# Patient Record
Sex: Female | Born: 1950 | Race: White | Hispanic: No | Marital: Single | Smoking: Never smoker
Health system: Southern US, Community
[De-identification: ages and names within clinical notes are randomized; demographics above are authoritative.]

## PROBLEM LIST (undated history)

## (undated) DIAGNOSIS — F419 Anxiety disorder, unspecified: Secondary | ICD-10-CM

## (undated) DIAGNOSIS — I10 Essential (primary) hypertension: Secondary | ICD-10-CM

## (undated) DIAGNOSIS — R609 Edema, unspecified: Secondary | ICD-10-CM

## (undated) HISTORY — PX: TUBAL LIGATION: SHX77

---

## 2001-06-19 ENCOUNTER — Emergency Department (HOSPITAL_COMMUNITY): Admission: EM | Admit: 2001-06-19 | Discharge: 2001-06-19 | Payer: Self-pay | Admitting: *Deleted

## 2005-06-25 ENCOUNTER — Ambulatory Visit: Payer: Self-pay | Admitting: *Deleted

## 2005-06-25 ENCOUNTER — Ambulatory Visit: Payer: Self-pay | Admitting: Nurse Practitioner

## 2005-07-01 ENCOUNTER — Emergency Department (HOSPITAL_COMMUNITY): Admission: EM | Admit: 2005-07-01 | Discharge: 2005-07-01 | Payer: Self-pay | Admitting: Emergency Medicine

## 2010-11-16 ENCOUNTER — Encounter: Payer: Self-pay | Admitting: Family Medicine

## 2010-11-16 ENCOUNTER — Emergency Department (HOSPITAL_BASED_OUTPATIENT_CLINIC_OR_DEPARTMENT_OTHER)
Admission: EM | Admit: 2010-11-16 | Discharge: 2010-11-16 | Disposition: A | Discharge status: Home / Self Care | Payer: Medicare Other | Attending: Emergency Medicine | Admitting: Emergency Medicine

## 2010-11-16 DIAGNOSIS — M5430 Sciatica, unspecified side: Secondary | ICD-10-CM

## 2010-11-16 DIAGNOSIS — M810 Age-related osteoporosis without current pathological fracture: Secondary | ICD-10-CM | POA: Insufficient documentation

## 2010-11-16 DIAGNOSIS — I10 Essential (primary) hypertension: Secondary | ICD-10-CM | POA: Insufficient documentation

## 2010-11-16 DIAGNOSIS — J45909 Unspecified asthma, uncomplicated: Secondary | ICD-10-CM | POA: Insufficient documentation

## 2010-11-16 DIAGNOSIS — E669 Obesity, unspecified: Secondary | ICD-10-CM | POA: Insufficient documentation

## 2010-11-16 DIAGNOSIS — M7989 Other specified soft tissue disorders: Secondary | ICD-10-CM | POA: Insufficient documentation

## 2010-11-16 HISTORY — DX: Edema, unspecified: R60.9

## 2010-11-16 HISTORY — DX: Anxiety disorder, unspecified: F41.9

## 2010-11-16 HISTORY — DX: Essential (primary) hypertension: I10

## 2010-11-16 MED ORDER — HYDROCODONE-ACETAMINOPHEN 5-500 MG PO TABS: 1.0000 | ORAL_TABLET | Freq: Four times a day (QID) | ORAL | Status: AC | PRN

## 2010-11-16 MED ORDER — KETOROLAC TROMETHAMINE 30 MG/ML IJ SOLN
30.0000 mg | Freq: Once | INTRAMUSCULAR | Status: AC
  Administered 2010-11-16: 30 mg via INTRAMUSCULAR
  Filled 2010-11-16: qty 1

## 2010-11-16 MED ORDER — HYDROCODONE-ACETAMINOPHEN 5-325 MG PO TABS
1.0000 | ORAL_TABLET | Freq: Once | ORAL | Status: AC
  Administered 2010-11-16: 1 via ORAL
  Filled 2010-11-16: qty 1

## 2010-11-16 MED ORDER — PENICILLIN V POTASSIUM 500 MG PO TABS: 500.0000 mg | ORAL_TABLET | Freq: Three times a day (TID) | ORAL | Status: AC

## 2010-11-16 NOTE — ED Provider Notes (Signed)
History     CSN: [id number] Arrival date & time: 11/16/2010 12:33 PM   Chief Complaint  Patient presents with  . Leg Swelling     (Include location/radiation/quality/duration/timing/severity/associated sxs/prior treatment) Patient is a 60 y.o. female presenting with lower extremity pain. The history is provided by the patient and a relative.  Foot Pain This is a recurrent problem. The current episode started more than 1 week ago. The problem occurs constantly. The problem has not changed since onset.  the patient does have pain in her left foot but she feels that the pain is radiating from her left lower back and left hip area. She does have a history of degenerative degenerative disc disease. Patient denies any fevers abdominal pain nausea or vomiting. She denies any difficulty with bowels or bladder. She does have a doctor's appointment at the end of this month for further evaluation. Patient does have chronic obesity as well as hypertension. The patient also complains of chronic dental caries in the right lower molars, no changes.   Past Medical History  Diagnosis Date  . Hypertension   . Osteoporosis   . Anxiety   . Swelling   . Asthma   . Gout      Past Surgical History  Procedure Date  . Tubal ligation     No family history on file.  History  Substance Use Topics  . Smoking status: Never Smoker   . Smokeless tobacco: Not on file  . Alcohol Use: No    OB History    Grav Para Term Preterm Abortions TAB SAB Ect Mult Living                  Review of Systems  All other systems reviewed and are negative.    Allergies  Sulfa antibiotics  Home Medications   Current Outpatient Rx  Name Route Sig Dispense Refill  . CYMBALTA PO Oral Take by mouth.      Marland Kitchen LASIX PO Oral Take by mouth.      Marland Kitchen HYDROCHLOROTHIAZIDE PO Oral Take by mouth.      Marland Kitchen PERCOCET PO Oral Take by mouth.        Physical Exam    BP 187/100  Pulse 84  Temp(Src) 97.8 F (36.6 C) (Oral)   Resp 20  Ht 5' 3.5" (1.613 m)  Wt 285 lb (129.275 kg)  BMI 49.69 kg/m2  SpO2 98%  Physical Exam  Constitutional: She appears well-developed and well-nourished. No distress.       Morbidly obese, deconditioning, no acute distress  HENT:  Head: Normocephalic.  Eyes: Pupils are equal, round, and reactive to light.  Cardiovascular: Normal heart sounds.   Pulmonary/Chest: Breath sounds normal.  Abdominal: Soft.  Musculoskeletal: Normal range of motion. She exhibits no edema and no tenderness.       Diffuse tenderness to left paralumbar musculature. No clubbing cyanosis or edema to the lower extremities. Skin is warm and dry with no erythema no increased warmth or rash.  Neurological: She is alert. She displays normal reflexes. No cranial nerve deficit. She exhibits normal muscle tone. Coordination normal.  Skin: Skin is warm and dry.    ED Course  Procedures  No results found for this or any previous visit. No results found.   No diagnosis found.   MDM Pt is seen and examined;  Initial history and physical completed.  Will follow.         Satsuki Zillmer A. Patrica Duel, MD 11/16/10 1304

## 2010-11-16 NOTE — ED Notes (Addendum)
Pt c/o bilateral foot pain and swelling x 3 days. Pt also c/o toothache in right lower. Pt sts she is "in between doctors right now and out of pain medications and other meds". Pt sts she did not take her BP med today.

## 2010-11-21 ENCOUNTER — Encounter (HOSPITAL_BASED_OUTPATIENT_CLINIC_OR_DEPARTMENT_OTHER): Payer: Self-pay | Admitting: *Deleted

## 2010-11-21 ENCOUNTER — Emergency Department (HOSPITAL_BASED_OUTPATIENT_CLINIC_OR_DEPARTMENT_OTHER)
Admission: EM | Admit: 2010-11-21 | Discharge: 2010-11-21 | Disposition: A | Discharge status: Home / Self Care | Payer: Medicare Other | Attending: Emergency Medicine | Admitting: Emergency Medicine

## 2010-11-21 DIAGNOSIS — M5430 Sciatica, unspecified side: Secondary | ICD-10-CM | POA: Insufficient documentation

## 2010-11-21 DIAGNOSIS — J45909 Unspecified asthma, uncomplicated: Secondary | ICD-10-CM | POA: Insufficient documentation

## 2010-11-21 DIAGNOSIS — I10 Essential (primary) hypertension: Secondary | ICD-10-CM | POA: Insufficient documentation

## 2010-11-21 DIAGNOSIS — Z79899 Other long term (current) drug therapy: Secondary | ICD-10-CM | POA: Insufficient documentation

## 2010-11-21 DIAGNOSIS — M810 Age-related osteoporosis without current pathological fracture: Secondary | ICD-10-CM | POA: Insufficient documentation

## 2010-11-21 LAB — DIFFERENTIAL
Eosinophils Absolute: 0.2 10*3/uL (ref 0.0–0.7)
Lymphocytes Relative: 49 % — ABNORMAL HIGH (ref 12–46)
Monocytes Absolute: 0.6 10*3/uL (ref 0.1–1.0)
Neutrophils Relative %: 39 % — ABNORMAL LOW (ref 43–77)

## 2010-11-21 LAB — BASIC METABOLIC PANEL
CO2: 30 mEq/L (ref 19–32)
Chloride: 99 mEq/L (ref 96–112)
Creatinine, Ser: 0.9 mg/dL (ref 0.50–1.10)
GFR calc Af Amer: >60 mL/min (ref >60)
Glucose, Bld: 112 mg/dL — ABNORMAL HIGH (ref 70–99)
Potassium: 3.3 mEq/L — ABNORMAL LOW (ref 3.5–5.1)

## 2010-11-21 LAB — CBC
MCV: 85.7 fL (ref 78.0–100.0)
Platelets: 269 10*3/uL (ref 150–400)
RBC: 4.56 MIL/uL (ref 3.87–5.11)

## 2010-11-21 MED ORDER — ENOXAPARIN SODIUM 100 MG/ML ~~LOC~~ SOLN
100.0000 mg | Freq: Once | SUBCUTANEOUS | Status: AC
  Administered 2010-11-21: 100 mg via SUBCUTANEOUS
  Filled 2010-11-21: qty 1

## 2010-11-21 MED ORDER — KETOROLAC TROMETHAMINE 30 MG/ML IJ SOLN
30.0000 mg | Freq: Once | INTRAMUSCULAR | Status: AC
  Administered 2010-11-21: 30 mg via INTRAVENOUS
  Filled 2010-11-21: qty 1

## 2010-11-21 MED ORDER — DIAZEPAM 5 MG PO TABS: 5.0000 mg | ORAL_TABLET | Freq: Three times a day (TID) | ORAL | Status: AC | PRN

## 2010-11-21 MED ORDER — OXYCODONE-ACETAMINOPHEN 5-325 MG PO TABS: 1.0000 | ORAL_TABLET | Freq: Four times a day (QID) | ORAL | Status: AC | PRN

## 2010-11-21 MED ORDER — HYDROMORPHONE HCL 2 MG/ML IJ SOLN
2.0000 mg | Freq: Once | INTRAMUSCULAR | Status: AC
  Administered 2010-11-21: 2 mg via INTRAMUSCULAR
  Filled 2010-11-21: qty 1

## 2010-11-21 NOTE — ED Provider Notes (Signed)
History     CSN: [id number] Arrival date & time: 11/21/2010  7:03 PM  Chief Complaint  Patient presents with  . Leg Pain    HPI  (Consider location/radiation/quality/duration/timing/severity/associated sxs/prior treatment)  HPI patient has been having pain in her leg for the last week or so. The pain seems to originate in her lower back and radiates down the left leg. It is sharp pain that increases with movement. The severity has been severe. Patient's not having urinary incontinence, no numbness or weakness, no fevers, no chest pain or shortness of breath. She has not had any abdominal pain. Family brought her back because she continues to have leg pain -- feels like her leg is a bit swollen. Patient does have a history of peripheral edema but she feels like it worse in the left leg now.  Past Medical History  Diagnosis Date  . Hypertension   . Osteoporosis   . Anxiety   . Swelling   . Asthma   . Gout     Past Surgical History  Procedure Date  . Tubal ligation     History reviewed. No pertinent family history.  History  Substance Use Topics  . Smoking status: Never Smoker   . Smokeless tobacco: Not on file  . Alcohol Use: No    OB History    Grav Para Term Preterm Abortions TAB SAB Ect Mult Living                  Review of Systems  Review of Systems  All other systems reviewed and are negative.    Allergies  Sulfa antibiotics  Home Medications   Current Outpatient Rx  Name Route Sig Dispense Refill  . HYDROCHLOROTHIAZIDE 25 MG PO TABS Oral Take 25 mg by mouth daily.      Marland Kitchen PENICILLIN V POTASSIUM 500 MG PO TABS Oral Take 1 tablet (500 mg total) by mouth 3 (three) times daily. 30 tablet 0  . ALBUTEROL SULFATE HFA 108 (90 BASE) MCG/ACT IN AERS Inhalation Inhale 2 puffs into the lungs every 6 (six) hours as needed. For asthma attack     . CYMBALTA PO Oral Take by mouth.      Marland Kitchen LASIX PO Oral Take by mouth.      Marland Kitchen HYDROCHLOROTHIAZIDE PO Oral Take by mouth.       Marland Kitchen HYDROCODONE-ACETAMINOPHEN 5-500 MG PO TABS Oral Take 1-2 tablets by mouth every 6 (six) hours as needed for pain. 18 tablet 0  . PERCOCET PO Oral Take by mouth.        Physical Exam    BP 160/98  Pulse 90  Temp(Src) 98.1 F (36.7 C) (Oral)  Resp 20  Ht 5' 3.5" (1.613 m)  Wt 300 lb (136.079 kg)  BMI 52.31 kg/m2  SpO2 97%  Physical Exam  Nursing note and vitals reviewed. Constitutional: No distress.  HENT:  Head: Normocephalic and atraumatic.  Right Ear: External ear normal.  Left Ear: External ear normal.  Eyes: Conjunctivae are normal. Right eye exhibits no discharge. Left eye exhibits no discharge. No scleral icterus.  Neck: Neck supple. No tracheal deviation present.  Cardiovascular: Normal rate, regular rhythm and intact distal pulses.   Pulmonary/Chest: Effort normal and breath sounds normal. No stridor. No respiratory distress. She has no wheezes. She has no rales.  Abdominal: Soft. Bowel sounds are normal. She exhibits no distension. There is no tenderness. There is no rebound and no guarding.  Musculoskeletal: She exhibits edema and tenderness.  Edema to bilateral lower extremities, mild tenderness to the left calf and left thigh, no erythema, no double cords, tenderness palpation lumbar spine, pain with range of motion of her back  Neurological: She is alert. She has normal strength. No sensory deficit. Cranial nerve deficit:  no gross defecits noted. She exhibits normal muscle tone. She displays no seizure activity. Coordination normal.  Skin: Skin is warm and dry. No rash noted.  Psychiatric: She has a normal mood and affect.    ED Course  Procedures (including critical care time)  Labs Reviewed  D-DIMER, QUANTITATIVE - Abnormal; Notable for the following:    D-Dimer, Quant 0.71 (*)    All other components within normal limits  DIFFERENTIAL - Abnormal; Notable for the following:    Neutrophils Relative 39 (*)    Lymphocytes Relative 49 (*)    All  other components within normal limits  CBC  BASIC METABOLIC PANEL   No results found.   1. Sciatica      MDM Patient presents with lower extremity pain and back pain. Her symptoms are most suggestive of sciatica however similar symptoms to suggest the possibility of a DVT. Overall have a low suspicion however she does have an abnormally elevated D-dimer test. Vascular ultrasound is not available the emergency department at this time. Patient will be given a shot of Lovenox and will have her followup tomorrow to have a Doppler study. Patient given prescription for pain medications. While emergency department she was given adult injection of Dilaudid IV.  At this time I do not feel there is any N. suggest acute emergency medical condition. She has been stabilized at this time. She'll followup as discussed previously.        Celene Kras, MD 11/21/10 2027

## 2010-11-21 NOTE — ED Notes (Signed)
Pt seen here Monday. Dx'd with sciatica. Told if no better, to return. States legs are swelling.

## 2010-11-22 ENCOUNTER — Ambulatory Visit (HOSPITAL_BASED_OUTPATIENT_CLINIC_OR_DEPARTMENT_OTHER)
Admission: RE | Admit: 2010-11-22 | Discharge: 2010-11-22 | Disposition: A | Discharge status: Home / Self Care | Payer: Medicare Other | Source: Ambulatory Visit | Attending: Emergency Medicine | Admitting: Emergency Medicine

## 2010-11-22 ENCOUNTER — Other Ambulatory Visit (HOSPITAL_BASED_OUTPATIENT_CLINIC_OR_DEPARTMENT_OTHER): Payer: Self-pay | Admitting: Emergency Medicine

## 2010-11-22 DIAGNOSIS — M79609 Pain in unspecified limb: Secondary | ICD-10-CM | POA: Insufficient documentation

## 2010-11-22 DIAGNOSIS — M25569 Pain in unspecified knee: Secondary | ICD-10-CM | POA: Insufficient documentation

## 2010-11-22 DIAGNOSIS — R52 Pain, unspecified: Secondary | ICD-10-CM

## 2010-12-16 ENCOUNTER — Emergency Department (HOSPITAL_BASED_OUTPATIENT_CLINIC_OR_DEPARTMENT_OTHER)
Admission: EM | Admit: 2010-12-16 | Discharge: 2010-12-16 | Disposition: A | Discharge status: Home / Self Care | Payer: Medicare Other | Attending: Emergency Medicine | Admitting: Emergency Medicine

## 2010-12-16 ENCOUNTER — Encounter (HOSPITAL_BASED_OUTPATIENT_CLINIC_OR_DEPARTMENT_OTHER): Payer: Self-pay | Admitting: *Deleted

## 2010-12-16 DIAGNOSIS — T23269A Burn of second degree of back of unspecified hand, initial encounter: Secondary | ICD-10-CM | POA: Insufficient documentation

## 2010-12-16 DIAGNOSIS — X12XXXA Contact with other hot fluids, initial encounter: Secondary | ICD-10-CM | POA: Insufficient documentation

## 2010-12-16 DIAGNOSIS — T23209A Burn of second degree of unspecified hand, unspecified site, initial encounter: Secondary | ICD-10-CM

## 2010-12-16 MED ORDER — BACITRACIN 500 UNIT/GM EX OINT: 1.0000 "application " | TOPICAL_OINTMENT | Freq: Two times a day (BID) | CUTANEOUS | Status: AC

## 2010-12-16 MED ORDER — DOXYCYCLINE HYCLATE 100 MG PO TABS: 100.0000 mg | ORAL_TABLET | Freq: Two times a day (BID) | ORAL | Status: AC

## 2010-12-16 MED ORDER — OXYCODONE-ACETAMINOPHEN 5-325 MG PO TABS
2.0000 | ORAL_TABLET | ORAL | Status: AC | PRN
  Administered 2010-12-16: 2 via ORAL
  Filled 2010-12-16: qty 2

## 2010-12-16 MED ORDER — TETANUS-DIPHTHERIA TOXOIDS TD 5-2 LFU IM INJ
0.5000 mL | INJECTION | Freq: Once | INTRAMUSCULAR | Status: AC
  Administered 2010-12-16: 0.5 mL via INTRAMUSCULAR
  Filled 2010-12-16: qty 0.5

## 2010-12-16 MED ORDER — DOXYCYCLINE HYCLATE 100 MG PO TABS
100.0000 mg | ORAL_TABLET | Freq: Once | ORAL | Status: AC
  Administered 2010-12-16: 100 mg via ORAL
  Filled 2010-12-16: qty 1

## 2010-12-16 MED ORDER — BACITRACIN 500 UNIT/GM EX OINT
1.0000 "application " | TOPICAL_OINTMENT | Freq: Two times a day (BID) | CUTANEOUS | Status: DC
  Administered 2010-12-16: 1 via TOPICAL
  Filled 2010-12-16: qty 0.9

## 2010-12-16 MED ORDER — OXYCODONE-ACETAMINOPHEN 5-325 MG PO TABS: 2.0000 | ORAL_TABLET | ORAL | Status: AC | PRN
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
## 2011-05-16 ENCOUNTER — Emergency Department (HOSPITAL_BASED_OUTPATIENT_CLINIC_OR_DEPARTMENT_OTHER)
Admission: EM | Admit: 2011-05-16 | Discharge: 2011-05-16 | Disposition: A | Discharge status: Home / Self Care | Payer: Medicare Other | Attending: Emergency Medicine | Admitting: Emergency Medicine

## 2011-05-16 ENCOUNTER — Encounter (HOSPITAL_BASED_OUTPATIENT_CLINIC_OR_DEPARTMENT_OTHER): Payer: Self-pay | Admitting: *Deleted

## 2011-05-16 ENCOUNTER — Emergency Department (INDEPENDENT_AMBULATORY_CARE_PROVIDER_SITE_OTHER): Payer: Medicare Other

## 2011-05-16 DIAGNOSIS — M549 Dorsalgia, unspecified: Secondary | ICD-10-CM

## 2011-05-16 DIAGNOSIS — M545 Low back pain, unspecified: Secondary | ICD-10-CM | POA: Insufficient documentation

## 2011-05-16 DIAGNOSIS — Z79899 Other long term (current) drug therapy: Secondary | ICD-10-CM | POA: Insufficient documentation

## 2011-05-16 DIAGNOSIS — I700 Atherosclerosis of aorta: Secondary | ICD-10-CM

## 2011-05-16 DIAGNOSIS — R109 Unspecified abdominal pain: Secondary | ICD-10-CM

## 2011-05-16 DIAGNOSIS — M25569 Pain in unspecified knee: Secondary | ICD-10-CM | POA: Insufficient documentation

## 2011-05-16 DIAGNOSIS — I10 Essential (primary) hypertension: Secondary | ICD-10-CM | POA: Insufficient documentation

## 2011-05-16 DIAGNOSIS — K389 Disease of appendix, unspecified: Secondary | ICD-10-CM

## 2011-05-16 LAB — URINE MICROSCOPIC-ADD ON

## 2011-05-16 LAB — DIFFERENTIAL
Basophils Absolute: 0 10*3/uL (ref 0.0–0.1)
Basophils Relative: 0 % (ref 0–1)
Eosinophils Absolute: 0.2 10*3/uL (ref 0.0–0.7)
Eosinophils Relative: 2 % (ref 0–5)
Lymphocytes Relative: 34 % (ref 12–46)
Lymphs Abs: 3.2 10*3/uL (ref 0.7–4.0)
Monocytes Absolute: 0.9 10*3/uL (ref 0.1–1.0)
Monocytes Relative: 9 % (ref 3–12)
Neutro Abs: 5.1 10*3/uL (ref 1.7–7.7)
Neutrophils Relative %: 55 % (ref 43–77)

## 2011-05-16 LAB — COMPREHENSIVE METABOLIC PANEL
ALT: 60 U/L — ABNORMAL HIGH (ref 0–35)
AST: 65 U/L — ABNORMAL HIGH (ref 0–37)
Albumin: 4.1 g/dL (ref 3.5–5.2)
Alkaline Phosphatase: 79 U/L (ref 39–117)
BUN: 22 mg/dL (ref 6–23)
CO2: 32 mEq/L (ref 19–32)
Calcium: 9.9 mg/dL (ref 8.4–10.5)
Chloride: 90 mEq/L — ABNORMAL LOW (ref 96–112)
Creatinine, Ser: 1 mg/dL (ref 0.50–1.10)
GFR calc Af Amer: 70 mL/min — ABNORMAL LOW (ref >90)
GFR calc non Af Amer: 60 mL/min — ABNORMAL LOW (ref >90)
Glucose, Bld: 120 mg/dL — ABNORMAL HIGH (ref 70–99)
Potassium: 3.2 mEq/L — ABNORMAL LOW (ref 3.5–5.1)
Sodium: 134 mEq/L — ABNORMAL LOW (ref 135–145)
Total Bilirubin: 0.5 mg/dL (ref 0.3–1.2)
Total Protein: 7.7 g/dL (ref 6.0–8.3)

## 2011-05-16 LAB — URINALYSIS, ROUTINE W REFLEX MICROSCOPIC
Glucose, UA: NEGATIVE mg/dL
Ketones, ur: 15 mg/dL — AB
Nitrite: NEGATIVE
Protein, ur: NEGATIVE mg/dL
Specific Gravity, Urine: 1.019 (ref 1.005–1.030)
Urobilinogen, UA: 1 mg/dL (ref 0.0–1.0)
pH: 6 (ref 5.0–8.0)

## 2011-05-16 LAB — CBC
HCT: 43 % (ref 36.0–46.0)
Hemoglobin: 15.2 g/dL — ABNORMAL HIGH (ref 12.0–15.0)
MCH: 29.7 pg (ref 26.0–34.0)
MCHC: 35.3 g/dL (ref 30.0–36.0)
MCV: 84 fL (ref 78.0–100.0)
Platelets: 305 10*3/uL (ref 150–400)
RBC: 5.12 MIL/uL — ABNORMAL HIGH (ref 3.87–5.11)
RDW: 12.9 % (ref 11.5–15.5)
WBC: 9.3 10*3/uL (ref 4.0–10.5)

## 2011-05-16 LAB — LIPASE, BLOOD: Lipase: 17 U/L (ref 11–59)

## 2011-05-16 MED ORDER — SODIUM CHLORIDE 0.9 % IV SOLN
20.0000 mL | INTRAVENOUS | Status: DC
  Administered 2011-05-16: 12:00:00 via INTRAVENOUS

## 2011-05-16 MED ORDER — IOHEXOL 300 MG/ML  SOLN
100.0000 mL | Freq: Once | INTRAMUSCULAR | Status: AC | PRN
  Administered 2011-05-16: 100 mL via INTRAVENOUS

## 2011-05-16 MED ORDER — HYDROMORPHONE HCL PF 1 MG/ML IJ SOLN
1.0000 mg | Freq: Once | INTRAMUSCULAR | Status: AC
  Administered 2011-05-16: 1 mg via INTRAVENOUS
  Filled 2011-05-16: qty 1

## 2011-05-16 MED ORDER — ONDANSETRON HCL 4 MG/2ML IJ SOLN
INTRAMUSCULAR | Status: AC
  Administered 2011-05-16: 4 mg via INTRAVENOUS
  Filled 2011-05-16: qty 2

## 2011-05-16 MED ORDER — CEPHALEXIN 500 MG PO CAPS: 500.0000 mg | ORAL_CAPSULE | Freq: Four times a day (QID) | ORAL | Status: AC

## 2011-05-16 MED ORDER — ONDANSETRON HCL 4 MG/2ML IJ SOLN
4.0000 mg | Freq: Once | INTRAMUSCULAR | Status: AC
  Administered 2011-05-16: 4 mg via INTRAVENOUS

## 2011-05-16 MED ORDER — HYDROMORPHONE HCL PF 1 MG/ML IJ SOLN
INTRAMUSCULAR | Status: AC
  Filled 2011-05-16: qty 1

## 2011-05-16 MED ORDER — OXYCODONE-ACETAMINOPHEN 5-325 MG PO TABS: 1.0000 | ORAL_TABLET | ORAL | Status: AC | PRN

## 2011-05-16 MED ORDER — IOHEXOL 300 MG/ML  SOLN
20.0000 mL | INTRAMUSCULAR | Status: AC
  Administered 2011-05-16 (×2): 20 mL via ORAL

## 2011-05-16 MED ORDER — FUROSEMIDE 20 MG PO TABS: 20.0000 mg | ORAL_TABLET | Freq: Every day | ORAL | Status: AC

## 2011-05-16 MED ORDER — SODIUM CHLORIDE 0.9 % IV BOLUS (SEPSIS)
1000.0000 mL | Freq: Once | INTRAVENOUS | Status: AC
  Administered 2011-05-16: 1000 mL via INTRAVENOUS

## 2011-05-16 MED ORDER — HYDROMORPHONE HCL PF 1 MG/ML IJ SOLN
1.0000 mg | Freq: Once | INTRAMUSCULAR | Status: AC
  Administered 2011-05-16: 1 mg via INTRAVENOUS

## 2011-05-16 NOTE — ED Notes (Signed)
Pt states that she is having an increase in back pain.  MD aware of same.

## 2011-05-16 NOTE — ED Notes (Signed)
Family at bedside. 

## 2011-05-16 NOTE — ED Notes (Signed)
Patient transported to CT 

## 2011-05-16 NOTE — ED Notes (Signed)
Patient states that she has bilateral knee pain and swelling.  Pt states that normally she can walk and is having difficulty with same.  Pt states that she also has left sided lower back pain.  Pt states that she also is having left sided abd pain.  Pt states that she has hx of diverticulitis.  PT states that she has had nausea with same.  Pt states that she has had decrease in appetite.  Pt denies any diarrhea.   States that she has been constipated.  Last BM was today.  Pt denies any urinary symptoms at this time.  Pt denies any injury to her knees.

## 2011-05-16 NOTE — Discharge Instructions (Signed)
Abdominal Pain Abdominal pain can be caused by many things. Your caregiver decides the seriousness of your pain by an examination and possibly blood tests and X-rays. Many cases can be observed and treated at home. Most abdominal pain is not caused by a disease and will probably improve without treatment. However, in many cases, more time must pass before a clear cause of the pain can be found. Before that point, it may not be known if you need more testing, or if hospitalization or surgery is needed. HOME CARE INSTRUCTIONS   Do not take laxatives unless directed by your caregiver.   Take pain medicine only as directed by your caregiver.   Only take over-the-counter or prescription medicines for pain, discomfort, or fever as directed by your caregiver.   Try a clear liquid diet (broth, tea, or water) for as long as directed by your caregiver. Slowly move to a bland diet as tolerated.  SEEK IMMEDIATE MEDICAL CARE IF:   The pain does not go away.   You have a fever.   You keep throwing up (vomiting).   The pain is felt only in portions of the abdomen. Pain in the right side could possibly be appendicitis. In an adult, pain in the left lower portion of the abdomen could be colitis or diverticulitis.   You pass bloody or black tarry stools.  MAKE SURE YOU:   Understand these instructions.   Will watch your condition.   Will get help right away if you are not doing well or get worse.  Document Released: 11/25/2004 Document Revised: 02/04/2011 Document Reviewed: 10/04/2007 Exit[employer] Patient Information 2012 [employer]  RESOURCE GUIDE  Dental Problems  Patients with Medicaid: [facility]                     [facility] [address]                                           [address] Phone:  [phone]                                                  Phone:  [phone]  If unable to pay or uninsured, contact:  [facility] or [place] to become qualified for the adult dental clinic.  Chronic Pain Problems Contact [facility]  [phone] Patients need to be referred by their primary care doctor.  Insufficient Money for Medicine Contact [employer]:  call "211" or [facility] 271-5999.  No Primary Care Doctor Call [facility]  832-8000 Other agencies that provide inexpensive medical care    [facility]  832-8035    [facility]  [phone]    [facility]  [phone]    [facility] Clinic  [phone]    [facility]  [phone]    [facility]  [phone]  Psychological Services [facility]  [phone] [facility]  [phone] [facility]   800 853-5163 (emergency services 641-4993)  Substance Abuse Resources [facility]  336-882-2125 [facility] 336-784-9470 [facility] 336-285-9073 [facility] 336-845-3988 Residential & Outpatient Substance Abuse Program  800-659-3381    Abuse/Neglect [place] Child Abuse Hotline (336) 641-3795 [place] Child Abuse Hotline 800-378-5315 (After Hours)  Emergency Shelter [facility] (336) 271-5985  [facility] (336) 275-9566 [facility] (704) 372-4663  MRSA Hotline #:   832-7006    [place] [facility]  [facility] [place]     [employer]                          [facility] [address] [place]                       [address]      [address]  [place]                                                [place]                            [place] Phone:  [phone]                                   Phone:  [phone]                 Phone:  [phone]  [facility] Phone:  342-8316  [place] Child Abuse Hotline (336) 342-1394 (336) 342-3537 (After  Hours)   

## 2011-06-04 NOTE — ED Provider Notes (Signed)
History    60yf with multiple complaints. Gradual onset of abdominal pain 2d ago. l sided. Does not radiate. No appreciable exacerbating or relieving factors. No fever or chills. Nausea. No vomiting. Anorexia. No urinary complaints. Also L lower back pain. Denies acute trauma. Also b/l knee pain which is chronic but worse than normally is. No numbness or tingling.   CSN: [id number]  Arrival date & time 05/16/11  1133   First MD Initiated Contact with Patient 05/16/11 1151      Chief Complaint  Patient presents with  . Back Pain  . Abdominal Pain  . Knee Pain    (Consider location/radiation/quality/duration/timing/severity/associated sxs/prior treatment) Patient is a 61 y.o. female presenting with back pain, abdominal pain, and knee pain. The history is provided by the patient.  Back Pain  The current episode started 2 days ago. The problem occurs constantly. The problem has not changed since onset.The pain is associated with no known injury. The pain is present in the lumbar spine. The quality of the pain is described as aching. The pain does not radiate. Associated symptoms include abdominal pain.  Abdominal Pain The primary symptoms of the illness include abdominal pain.  Additional symptoms associated with the illness include back pain.  Knee Pain Associated symptoms include abdominal pain.    Past Medical History  Diagnosis Date  . Hypertension   . Osteoporosis   . Anxiety   . Swelling   . Asthma   . Gout     Past Surgical History  Procedure Date  . Tubal ligation     History reviewed. No pertinent family history.  History  Substance Use Topics  . Smoking status: Never Smoker   . Smokeless tobacco: Not on file  . Alcohol Use: No    OB History    Grav Para Term Preterm Abortions TAB SAB Ect Mult Living                  Review of Systems  Gastrointestinal: Positive for abdominal pain.  Musculoskeletal: Positive for back pain.     Review of symptoms  negative unless otherwise noted in HPI.   Allergies  Sulfa antibiotics  Home Medications   Current Outpatient Rx  Name Route Sig Dispense Refill  . ACETAMINOPHEN 500 MG PO TABS Oral Take 1,000 mg by mouth every 6 (six) hours as needed. For pain     . ALBUTEROL SULFATE HFA 108 (90 BASE) MCG/ACT IN AERS Inhalation Inhale 2 puffs into the lungs every 6 (six) hours as needed. For asthma attack     . DULOXETINE HCL 60 MG PO CPEP Oral Take 60 mg by mouth daily.      . CYMBALTA PO Oral Take by mouth.      Marland Kitchen LASIX PO Oral Take by mouth.      . FUROSEMIDE 20 MG PO TABS Oral Take 1 tablet (20 mg total) by mouth daily. 30 tablet 0  . HYDROCHLOROTHIAZIDE 25 MG PO TABS Oral Take 25 mg by mouth daily.      Marland Kitchen HYDROCHLOROTHIAZIDE PO Oral Take by mouth.      Marland Kitchen PERCOCET PO Oral Take by mouth.      . OXYCODONE-ACETAMINOPHEN 10-325 MG PO TABS Oral Take 1 tablet by mouth every 4 (four) hours as needed. For pain       BP 106/69  Pulse 100  Temp(Src) 98.2 F (36.8 C) (Oral)  Resp 18  Ht 5\' 3"  (1.6 m)  Wt 280 lb (127.007 kg)  BMI 49.60 kg/m2  SpO2 98%  Physical Exam  Nursing note and vitals reviewed. Constitutional: No distress.       Laying in bed nad. Obese.  HENT:  Head: Normocephalic and atraumatic.  Eyes: Conjunctivae are normal. Right eye exhibits no discharge. Left eye exhibits no discharge.  Neck: Neck supple.  Cardiovascular: Normal rate, regular rhythm and normal heart sounds.  Exam reveals no gallop and no friction rub.   No murmur heard. Pulmonary/Chest: Effort normal and breath sounds normal. No respiratory distress.  Abdominal: Soft. She exhibits no distension and no mass. There is tenderness. There is no rebound and no guarding.       eam somewhat limited by body habitus. Does not appear to be distended. Mild diffuse tenderness, perhaps worse on L side. No guarding or reboubnd.  Genitourinary:       No cva tenderness.  Musculoskeletal: She exhibits no edema and no tenderness.    Neurological: She is alert.  Skin: Skin is warm and dry. She is not diaphoretic.  Psychiatric: She has a normal mood and affect. Her behavior is normal. Thought content normal.    ED Course  Procedures (including critical care time)  Labs Reviewed  CBC - Abnormal; Notable for the following:    RBC 5.12 (*)    Hemoglobin 15.2 (*)    All other components within normal limits  COMPREHENSIVE METABOLIC PANEL - Abnormal; Notable for the following:    Sodium 134 (*)    Potassium 3.2 (*)    Chloride 90 (*)    Glucose, Bld 120 (*)    AST 65 (*)    ALT 60 (*)    GFR calc non Af Amer 60 (*)    GFR calc Af Amer 70 (*)    All other components within normal limits  URINALYSIS, ROUTINE W REFLEX MICROSCOPIC - Abnormal; Notable for the following:    Color, Urine AMBER (*) BIOCHEMICALS MAY BE AFFECTED BY COLOR   APPearance CLOUDY (*)    Hgb urine dipstick SMALL (*)    Bilirubin Urine SMALL (*)    Ketones, ur 15 (*)    Leukocytes, UA SMALL (*)    All other components within normal limits  URINE MICROSCOPIC-ADD ON - Abnormal; Notable for the following:    Squamous Epithelial / LPF FEW (*)    Bacteria, UA MANY (*)    All other components within normal limits  DIFFERENTIAL  LIPASE, BLOOD   No results found.  Ct Abdomen Pelvis W Contrast  05/16/2011  *RADIOLOGY REPORT*  Clinical Data: Back pain and abdominal pain  CT ABDOMEN AND PELVIS WITH CONTRAST  Technique:  Multidetector CT imaging of the abdomen and pelvis was performed following the standard protocol during bolus administration of intravenous contrast.  Contrast: OMNIPAQUE IOHEXOL 300 MG/ML IJ SOLN  Comparison: None.  Findings: Lung bases are clear.  No pericardial fluid.  No focal hepatic lesion.  No gallbladder, pancreas, spleen, adrenal glands, and kidneys are normal.  No evidence of ureterolithiasis.  The stomach, small bowel appendix, and cecum are normal.  There is an appendicolith within the distal appendix.  No  periappendiceal inflammation.  There are diverticula of the sigmoid colon.  No acute inflammation.  Abdominal aorta is normal caliber. No periportal lymphadenopathy. Mild calcification aorta.  No free fluid the pelvis.  The bladder uterus and ovaries are normal.  No pelvic lymphadenopathy. Review of  bone windows demonstrates no aggressive osseous lesions.  IMPRESSION:  1.  No acute abdominal  or pelvic findings. 2.  Appendicolith within the appendix without evidence of appendicitis. 3.  Atherosclerotic calcification of the aorta. 4.  Mild sigmoid diverticulosis without diverticulitis.  Original Report Authenticated By: Genevive Bi, M.D.   1. Abdominal pain       MDM  60yF with multiple complaints, primarily abdominal pain. Suspect viral illness. Low suspicion for acute surgical abdomen. CT neg for serious acute pathology. Minor labs abnormalities but nothing which would explain pt's symptoms. Pt feeling much better with symptomatic tx. Plan continued symptomatic tx and outpt fu . Return precautions discussed.        Raeford Razor, MD 06/04/11 [id number]

## 2011-09-22 ENCOUNTER — Emergency Department (HOSPITAL_BASED_OUTPATIENT_CLINIC_OR_DEPARTMENT_OTHER)
Admission: EM | Admit: 2011-09-22 | Discharge: 2011-09-22 | Disposition: A | Discharge status: Home / Self Care | Payer: Medicare Other | Attending: Emergency Medicine | Admitting: Emergency Medicine

## 2011-09-22 ENCOUNTER — Emergency Department (HOSPITAL_BASED_OUTPATIENT_CLINIC_OR_DEPARTMENT_OTHER): Payer: Medicare Other

## 2011-09-22 ENCOUNTER — Encounter (HOSPITAL_BASED_OUTPATIENT_CLINIC_OR_DEPARTMENT_OTHER): Payer: Self-pay

## 2011-09-22 DIAGNOSIS — M549 Dorsalgia, unspecified: Secondary | ICD-10-CM

## 2011-09-22 DIAGNOSIS — I10 Essential (primary) hypertension: Secondary | ICD-10-CM | POA: Insufficient documentation

## 2011-09-22 DIAGNOSIS — R079 Chest pain, unspecified: Secondary | ICD-10-CM | POA: Insufficient documentation

## 2011-09-22 LAB — URINE MICROSCOPIC-ADD ON

## 2011-09-22 LAB — CBC WITH DIFFERENTIAL/PLATELET
Basophils Relative: 0 % (ref 0–1)
HCT: 38.5 % (ref 36.0–46.0)
Hemoglobin: 13.4 g/dL (ref 12.0–15.0)
Lymphocytes Relative: 38 % (ref 12–46)
Lymphs Abs: 2.5 10*3/uL (ref 0.7–4.0)
MCHC: 34.8 g/dL (ref 30.0–36.0)
Monocytes Absolute: 0.5 10*3/uL (ref 0.1–1.0)
Monocytes Relative: 7 % (ref 3–12)
Neutro Abs: 3.5 10*3/uL (ref 1.7–7.7)
Neutrophils Relative %: 52 % (ref 43–77)
RBC: 4.58 MIL/uL (ref 3.87–5.11)

## 2011-09-22 LAB — URINALYSIS, ROUTINE W REFLEX MICROSCOPIC
Bilirubin Urine: NEGATIVE
Glucose, UA: NEGATIVE mg/dL
Hgb urine dipstick: NEGATIVE
Nitrite: NEGATIVE
Specific Gravity, Urine: 1.018 (ref 1.005–1.030)
pH: 5 (ref 5.0–8.0)

## 2011-09-22 LAB — COMPREHENSIVE METABOLIC PANEL
ALT: 66 U/L — ABNORMAL HIGH (ref 0–35)
AST: 62 U/L — ABNORMAL HIGH (ref 0–37)
Albumin: 3.9 g/dL (ref 3.5–5.2)
BUN: 21 mg/dL (ref 6–23)
CO2: 27 mEq/L (ref 19–32)
Calcium: 9.3 mg/dL (ref 8.4–10.5)
Chloride: 97 mEq/L (ref 96–112)
Creatinine, Ser: 0.9 mg/dL (ref 0.50–1.10)
Glucose, Bld: 102 mg/dL — ABNORMAL HIGH (ref 70–99)
Potassium: 4.1 mEq/L (ref 3.5–5.1)
Total Protein: 7.1 g/dL (ref 6.0–8.3)

## 2011-09-22 NOTE — ED Notes (Signed)
C/o left side CP. Left leg pain/swelling, left lower back pain x 2 days

## 2011-09-22 NOTE — ED Provider Notes (Signed)
History     CSN: [id number]  Arrival date & time 09/22/11  1102   First MD Initiated Contact with Patient 09/22/11 1146      Chief Complaint  Patient presents with  . Leg Pain  . Chest Pain    (Consider location/radiation/quality/duration/timing/severity/associated sxs/prior treatment) HPI  Patient complaining of left-sided back pain. She states that she has also had some sharp left anterior chest pain for 2 days. States the pain then began radiating up the entire left side of her body and down the left side of her body to her foot. Pain in the left that is the center of the pain and increases whenever she moves. She denies any urinary tract infection symptoms. She states that she has had some swelling of her left leg and left lower back. She denies any shortness of breath, fever, localized neurological deficit  Past Medical History  Diagnosis Date  . Hypertension   . Osteoporosis   . Anxiety   . Swelling   . Asthma   . Gout     Past Surgical History  Procedure Date  . Tubal ligation     No family history on file.  History  Substance Use Topics  . Smoking status: Never Smoker   . Smokeless tobacco: Not on file  . Alcohol Use: No    OB History    Grav Para Term Preterm Abortions TAB SAB Ect Mult Living                  Review of Systems  All other systems reviewed and are negative.    Allergies  Sulfa antibiotics  Home Medications   Current Outpatient Rx  Name Route Sig Dispense Refill  . CARISOPRODOL 250 MG PO TABS Oral Take 350 mg by mouth 4 (four) times daily.    Marland Kitchen CLONAZEPAM 1 MG PO TABS Oral Take 1 mg by mouth 2 (two) times daily as needed.    Marland Kitchen OLMESARTAN MEDOXOMIL-HCTZ 40-25 MG PO TABS Oral Take 1 tablet by mouth daily.    . ACETAMINOPHEN 500 MG PO TABS Oral Take 1,000 mg by mouth every 6 (six) hours as needed. For pain     . ALBUTEROL SULFATE HFA 108 (90 BASE) MCG/ACT IN AERS Inhalation Inhale 2 puffs into the lungs every 6 (six) hours as  needed. For asthma attack     . DULOXETINE HCL 60 MG PO CPEP Oral Take 60 mg by mouth daily.      . CYMBALTA PO Oral Take by mouth.      Marland Kitchen LASIX PO Oral Take by mouth.      . FUROSEMIDE 20 MG PO TABS Oral Take 1 tablet (20 mg total) by mouth daily. 30 tablet 0  . HYDROCHLOROTHIAZIDE 25 MG PO TABS Oral Take 25 mg by mouth daily.      Marland Kitchen HYDROCHLOROTHIAZIDE PO Oral Take by mouth.      Marland Kitchen PERCOCET PO Oral Take by mouth.      . OXYCODONE-ACETAMINOPHEN 10-325 MG PO TABS Oral Take 1 tablet by mouth every 4 (four) hours as needed. For pain       BP 145/104  Pulse 91  Temp 98.7 F (37.1 C) (Oral)  Resp 20  Ht 5\' 3"  (1.6 m)  Wt 294 lb (133.358 kg)  BMI 52.08 kg/m2  SpO2 98%  Physical Exam  Nursing note and vitals reviewed. Constitutional: She is oriented to person, place, and time. She appears well-developed and well-nourished.  HENT:  Head: Normocephalic  and atraumatic.  Eyes: Pupils are equal, round, and reactive to light.  Neck: Normal range of motion. Neck supple.  Cardiovascular: Normal rate and regular rhythm.   Pulmonary/Chest: Effort normal and breath sounds normal.  Abdominal: Soft. Bowel sounds are normal.  Musculoskeletal: Normal range of motion.       Back with some paralumbar tenderness onleft.  No redness, no swelling.    Neurological: She is alert and oriented to person, place, and time. She has normal reflexes.  Skin: Skin is warm.  Psychiatric: She has a normal mood and affect.    ED Course  Procedures (including critical care time)  Labs Reviewed  URINALYSIS, ROUTINE W REFLEX MICROSCOPIC - Abnormal; Notable for the following:    APPearance CLOUDY (*)     Leukocytes, UA SMALL (*)     All other components within normal limits  URINE MICROSCOPIC-ADD ON - Abnormal; Notable for the following:    Squamous Epithelial / LPF FEW (*)     Bacteria, UA FEW (*)     All other components within normal limits  CBC WITH DIFFERENTIAL  COMPREHENSIVE METABOLIC PANEL  TROPONIN I    Dg Chest 2 View  09/22/2011  *RADIOLOGY REPORT*  Clinical Data: Chest pain  CHEST - 2 VIEW  Comparison: None  Findings: The heart size and mediastinal contours are within normal limits.  Both lungs are clear. There is a scoliosis deformity involving the thoracic spine.  IMPRESSION: No active cardiopulmonary abnormalities.  Original Report Authenticated By: Rosealee Albee, M.D.     No diagnosis found.   Results for orders placed during the hospital encounter of 09/22/11  CBC WITH DIFFERENTIAL      Component Value Range   WBC 6.7  4.0 - 10.5 K/uL   RBC 4.58  3.87 - 5.11 MIL/uL   Hemoglobin 13.4  12.0 - 15.0 g/dL   HCT 16.1  09.6 - 04.5 %   MCV 84.1  78.0 - 100.0 fL   MCH 29.3  26.0 - 34.0 pg   MCHC 34.8  30.0 - 36.0 g/dL   RDW 40.9  81.1 - 91.4 %   Platelets 255  150 - 400 K/uL   Neutrophils Relative 52  43 - 77 %   Neutro Abs 3.5  1.7 - 7.7 K/uL   Lymphocytes Relative 38  12 - 46 %   Lymphs Abs 2.5  0.7 - 4.0 K/uL   Monocytes Relative 7  3 - 12 %   Monocytes Absolute 0.5  0.1 - 1.0 K/uL   Eosinophils Relative 2  0 - 5 %   Eosinophils Absolute 0.2  0.0 - 0.7 K/uL   Basophils Relative 0  0 - 1 %   Basophils Absolute 0.0  0.0 - 0.1 K/uL  COMPREHENSIVE METABOLIC PANEL      Component Value Range   Sodium 135  135 - 145 mEq/L   Potassium 4.1  3.5 - 5.1 mEq/L   Chloride 97  96 - 112 mEq/L   CO2 27  19 - 32 mEq/L   Glucose, Bld 102 (*) 70 - 99 mg/dL   BUN 21  6 - 23 mg/dL   Creatinine, Ser 7.82  0.50 - 1.10 mg/dL   Calcium 9.3  8.4 - 95.6 mg/dL   Total Protein 7.1  6.0 - 8.3 g/dL   Albumin 3.9  3.5 - 5.2 g/dL   AST 62 (*) 0 - 37 U/L   ALT 66 (*) 0 - 35 U/L   Alkaline  Phosphatase 70  39 - 117 U/L   Total Bilirubin 0.5  0.3 - 1.2 mg/dL   GFR calc non Af Amer 68 (*) >90 mL/min   GFR calc Af Amer 79 (*) >90 mL/min  URINALYSIS, ROUTINE W REFLEX MICROSCOPIC      Component Value Range   Color, Urine YELLOW  YELLOW   APPearance CLOUDY (*) CLEAR   Specific Gravity, Urine 1.018   1.005 - 1.030   pH 5.0  5.0 - 8.0   Glucose, UA NEGATIVE  NEGATIVE mg/dL   Hgb urine dipstick NEGATIVE  NEGATIVE   Bilirubin Urine NEGATIVE  NEGATIVE   Ketones, ur NEGATIVE  NEGATIVE mg/dL   Protein, ur NEGATIVE  NEGATIVE mg/dL   Urobilinogen, UA 0.2  0.0 - 1.0 mg/dL   Nitrite NEGATIVE  NEGATIVE   Leukocytes, UA SMALL (*) NEGATIVE  TROPONIN I      Component Value Range   Troponin I <0.30  <0.30 ng/mL  URINE MICROSCOPIC-ADD ON      Component Value Range   Squamous Epithelial / LPF FEW (*) RARE   WBC, UA 0-2  <3 WBC/hpf   RBC / HPF    <3 RBC/hpf   Value: NO FORMED ELEMENTS SEEN ON URINE MICROSCOPIC EXAMINATION   Bacteria, UA FEW (*) RARE    MDM     Date: 09/22/2011  Rate: 91  Rhythm: normal sinus rhythm  QRS Axis: normal  Intervals: normal  ST/T Wave abnormalities: normal  Conduction Disutrbances: none  Narrative Interpretation: unremarkable          Hilario Quarry, MD 09/22/11 1512

## 2015-05-12 ENCOUNTER — Encounter (HOSPITAL_BASED_OUTPATIENT_CLINIC_OR_DEPARTMENT_OTHER): Payer: Self-pay | Admitting: *Deleted

## 2015-05-12 ENCOUNTER — Emergency Department (HOSPITAL_BASED_OUTPATIENT_CLINIC_OR_DEPARTMENT_OTHER)
Admission: EM | Admit: 2015-05-12 | Discharge: 2015-05-12 | Disposition: A | Discharge status: Home / Self Care | Payer: Medicare PPO | Attending: Emergency Medicine | Admitting: Emergency Medicine

## 2015-05-12 ENCOUNTER — Emergency Department (HOSPITAL_BASED_OUTPATIENT_CLINIC_OR_DEPARTMENT_OTHER): Payer: Medicare PPO

## 2015-05-12 DIAGNOSIS — J45909 Unspecified asthma, uncomplicated: Secondary | ICD-10-CM | POA: Insufficient documentation

## 2015-05-12 DIAGNOSIS — W06XXXA Fall from bed, initial encounter: Secondary | ICD-10-CM | POA: Insufficient documentation

## 2015-05-12 DIAGNOSIS — R110 Nausea: Secondary | ICD-10-CM | POA: Insufficient documentation

## 2015-05-12 DIAGNOSIS — F419 Anxiety disorder, unspecified: Secondary | ICD-10-CM | POA: Insufficient documentation

## 2015-05-12 DIAGNOSIS — Z79899 Other long term (current) drug therapy: Secondary | ICD-10-CM | POA: Diagnosis not present

## 2015-05-12 DIAGNOSIS — I10 Essential (primary) hypertension: Secondary | ICD-10-CM | POA: Diagnosis not present

## 2015-05-12 DIAGNOSIS — Y9389 Activity, other specified: Secondary | ICD-10-CM | POA: Diagnosis not present

## 2015-05-12 DIAGNOSIS — E669 Obesity, unspecified: Secondary | ICD-10-CM | POA: Insufficient documentation

## 2015-05-12 DIAGNOSIS — I809 Phlebitis and thrombophlebitis of unspecified site: Secondary | ICD-10-CM

## 2015-05-12 DIAGNOSIS — Y998 Other external cause status: Secondary | ICD-10-CM | POA: Diagnosis not present

## 2015-05-12 DIAGNOSIS — M5432 Sciatica, left side: Secondary | ICD-10-CM | POA: Diagnosis not present

## 2015-05-12 DIAGNOSIS — I8002 Phlebitis and thrombophlebitis of superficial vessels of left lower extremity: Secondary | ICD-10-CM | POA: Diagnosis not present

## 2015-05-12 DIAGNOSIS — S8992XA Unspecified injury of left lower leg, initial encounter: Secondary | ICD-10-CM | POA: Diagnosis present

## 2015-05-12 DIAGNOSIS — Y9289 Other specified places as the place of occurrence of the external cause: Secondary | ICD-10-CM | POA: Insufficient documentation

## 2015-05-12 MED ORDER — HYDROMORPHONE HCL 1 MG/ML IJ SOLN
1.0000 mg | Freq: Once | INTRAMUSCULAR | Status: AC
Start: 2015-05-12 — End: 2015-05-12
  Administered 2015-05-12: 1 mg via INTRAMUSCULAR
  Filled 2015-05-12: qty 1

## 2015-05-12 MED ORDER — OLMESARTAN MEDOXOMIL-HCTZ 40-25 MG PO TABS: 1.0000 | ORAL_TABLET | Freq: Every day | ORAL | Status: AC

## 2015-05-12 MED ORDER — HYDROMORPHONE HCL 1 MG/ML IJ SOLN
1.0000 mg | Freq: Once | INTRAMUSCULAR | Status: AC
  Administered 2015-05-12: 1 mg via INTRAMUSCULAR
  Filled 2015-05-12: qty 1

## 2015-05-12 MED ORDER — PROMETHAZINE HCL 25 MG/ML IJ SOLN
12.5000 mg | Freq: Once | INTRAMUSCULAR | Status: AC
  Administered 2015-05-12: 12.5 mg via INTRAMUSCULAR
  Filled 2015-05-12: qty 1

## 2015-05-12 MED ORDER — DEXAMETHASONE SODIUM PHOSPHATE 10 MG/ML IJ SOLN
10.0000 mg | Freq: Once | INTRAMUSCULAR | Status: AC
  Administered 2015-05-12: 10 mg via INTRAMUSCULAR
  Filled 2015-05-12: qty 1

## 2015-05-12 MED ORDER — ONDANSETRON 4 MG PO TBDP
4.0000 mg | ORAL_TABLET | Freq: Once | ORAL | Status: AC
  Administered 2015-05-12: 4 mg via ORAL
  Filled 2015-05-12: qty 1

## 2015-05-12 NOTE — Discharge Instructions (Signed)
You should have a repeat ultrasound in 1-2 weeks to recheck for blood clots.  This can be arranged by your primary care provider.  Phlebitis Phlebitis is soreness and swelling (inflammation) of a vein. This can occur in your arms, legs, or torso (trunk), as well as deeper inside your body. Phlebitis is usually not serious when it occurs close to the surface of the body. However, it can cause serious problems when it occurs in a vein deeper inside the body. CAUSES  Phlebitis can be triggered by various things, including:   Reduced blood flow through your veins. This can happen with:  Bed rest over a long period.  Long-distance travel.  Injury.  Surgery.  Being overweight (obese) or pregnant.  Having an IV tube put in the vein and getting certain medicines through the vein.  Cancer and cancer treatment.  Use of illegal drugs taken through the vein.  Inflammatory diseases.  Inherited (genetic) diseases that increase the risk of blood clots.  Hormone therapy, such as birth control pills. SIGNS AND SYMPTOMS   Red, tender, swollen, and painful area on your skin. Usually, the area will be long and narrow.  Firmness along the center of the affected area. This can indicate that a blood clot has formed.  Low-grade fever. DIAGNOSIS  A health care provider can usually diagnose phlebitis by examining the affected area and asking about your symptoms. To check for infection or blood clots, your health care provider may order blood tests or an ultrasound exam of the area. Blood tests and your family history may also indicate if you have an underlying genetic disease that causes blood clots. Occasionally, a piece of tissue is taken from the body (biopsy sample) if an unusual cause of phlebitis is suspected. TREATMENT  Treatment will vary depending on the severity of the condition and the area of the body affected. Treatment may include:  Use of a warm compress or heating pad.  Use of  compression stockings or bandages.  Anti-inflammatory medicines.  Removal of any IV tube that may be causing the problem.  Medicines that kill germs (antibiotics) if an infection is present.  Blood-thinning medicines if a blood clot is suspected or present.  In rare cases, surgery may be needed to remove damaged sections of vein. HOME CARE INSTRUCTIONS   Only take over-the-counter or prescription medicines as directed by your health care provider. Take all medicines exactly as prescribed.  Raise (elevate) the affected area above the level of your heart as directed by your health care provider.  Apply a warm compress or heating pad to the affected area as directed by your health care provider. Do not sleep with the heating pad.  Use compression stockings or bandages as directed. These will speed healing and prevent the condition from coming back.  If you are on blood thinners:  Get follow-up blood tests as directed by your health care provider.  Check with your health care provider before using any new medicines.  Carry a medical alert card or wear your medical alert jewelry to show that you are on blood thinners.  For phlebitis in the legs:  Avoid prolonged standing or bed rest.  Keep your legs moving. Raise your legs when sitting or lying.  Do not smoke.  Women, particularly those over the age of 57, should consider the risks and benefits of taking the contraceptive pill. This kind of hormone treatment can increase your risk for blood clots.  Follow up with your health care provider  as directed. SEEK MEDICAL CARE IF:   You have unusual bruising or any bleeding problems.  Your swelling or pain in the affected area is not improving.  You are on anti-inflammatory medicine, and you develop belly (abdominal) pain. SEEK IMMEDIATE MEDICAL CARE IF:   You have a sudden onset of chest pain or difficulty breathing.  You have a fever or persistent symptoms for more than 2-3  days.  You have a fever and your symptoms suddenly get worse. MAKE SURE YOU:  Understand these instructions.  Will watch your condition.  Will get help right away if you are not doing well or get worse.   This information is not intended to replace advice given to you by your health care provider. Make sure you discuss any questions you have with your health care provider.   Document Released: 02/09/2001 Document Revised: 12/06/2012 Document Reviewed: 10/23/2012 Elsevier Interactive Patient Education 2016 [employer]  Sciatica Sciatica is pain, weakness, numbness, or tingling along the path of the sciatic nerve. The nerve starts in the lower back and runs down the back of each leg. The nerve controls the muscles in the lower leg and in the back of the knee, while also providing sensation to the back of the thigh, lower leg, and the sole of your foot. Sciatica is a symptom of another medical condition. For instance, nerve damage or certain conditions, such as a herniated disk or bone spur on the spine, pinch or put pressure on the sciatic nerve. This causes the pain, weakness, or other sensations normally associated with sciatica. Generally, sciatica only affects one side of the body. CAUSES   Herniated or slipped disc.  Degenerative disk disease.  A pain disorder involving the narrow muscle in the buttocks (piriformis syndrome).  Pelvic injury or fracture.  Pregnancy.  Tumor (rare). SYMPTOMS  Symptoms can vary from mild to very severe. The symptoms usually travel from the low back to the buttocks and down the back of the leg. Symptoms can include:  Mild tingling or dull aches in the lower back, leg, or hip.  Numbness in the back of the calf or sole of the foot.  Burning sensations in the lower back, leg, or hip.  Sharp pains in the lower back, leg, or hip.  Leg weakness.  Severe back pain inhibiting movement. These symptoms may get worse with coughing, sneezing,  laughing, or prolonged sitting or standing. Also, being overweight may worsen symptoms. DIAGNOSIS  Your caregiver will perform a physical exam to look for common symptoms of sciatica. He or she may ask you to do certain movements or activities that would trigger sciatic nerve pain. Other tests may be performed to find the cause of the sciatica. These may include:  Blood tests.  X-rays.  Imaging tests, such as an MRI or CT scan. TREATMENT  Treatment is directed at the cause of the sciatic pain. Sometimes, treatment is not necessary and the pain and discomfort goes away on its own. If treatment is needed, your caregiver may suggest:  Over-the-counter medicines to relieve pain.  Prescription medicines, such as anti-inflammatory medicine, muscle relaxants, or narcotics.  Applying heat or ice to the painful area.  Steroid injections to lessen pain, irritation, and inflammation around the nerve.  Reducing activity during periods of pain.  Exercising and stretching to strengthen your abdomen and improve flexibility of your spine. Your caregiver may suggest losing weight if the extra weight makes the back pain worse.  Physical therapy.  Surgery to eliminate  what is pressing or pinching the nerve, such as a bone spur or part of a herniated disk. HOME CARE INSTRUCTIONS   Only take over-the-counter or prescription medicines for pain or discomfort as directed by your caregiver.  Apply ice to the affected area for 20 minutes, 3-4 times a day for the first 48-72 hours. Then try heat in the same way.  Exercise, stretch, or perform your usual activities if these do not aggravate your pain.  Attend physical therapy sessions as directed by your caregiver.  Keep all follow-up appointments as directed by your caregiver.  Do not wear high heels or shoes that do not provide proper support.  Check your mattress to see if it is too soft. A firm mattress may lessen your pain and discomfort. SEEK  IMMEDIATE MEDICAL CARE IF:   You lose control of your bowel or bladder (incontinence).  You have increasing weakness in the lower back, pelvis, buttocks, or legs.  You have redness or swelling of your back.  You have a burning sensation when you urinate.  You have pain that gets worse when you lie down or awakens you at night.  Your pain is worse than you have experienced in the past.  Your pain is lasting longer than 4 weeks.  You are suddenly losing weight without reason. MAKE SURE YOU:  Understand these instructions.  Will watch your condition.  Will get help right away if you are not doing well or get worse.   This information is not intended to replace advice given to you by your health care provider. Make sure you discuss any questions you have with your health care provider.   Document Released: 02/09/2001 Document Revised: 11/06/2014 Document Reviewed: 06/27/2011 Elsevier Interactive Patient Education [employer]

## 2015-05-12 NOTE — ED Notes (Signed)
Pt at xray. Daughter in room. Daughter reports pt fell twice two weeks ago. Hx of having trouble with legs and normally uses walker, but did not have. Left leg swollen. Bruising noted per daughter. Also some bruising to back per daughter.

## 2015-05-12 NOTE — ED Provider Notes (Signed)
CSN: [id number]     Arrival date & time 05/12/15  1259 History  By signing my name below, I, Kristi Stone, attest that this documentation has been prepared under the direction and in the presence of Rolan BuccoMelanie Charlesa Ehle, MD. Electronically Signed: Tanda RockersMargaux Stone, ED Scribe. 05/12/2015. 3:51 PM.   Chief Complaint  Patient presents with  . Leg Pain   The history is provided by the patient. No language interpreter was used.     HPI Comments: Kristi Stone is a 65 y.o. female with PMHx HTN and Gout who presents to the Emergency Department complaining of gradual onset, constant, left lower leg pain and swelling s/p ground level fall that occurred 3 days ago. Pt states that she slipped off of her bed and hit her leg on a table. No head injury or LOC. Pt reports that her vein is clogged and feels very hard. She also complains of intermittent lower back pain that radiates down her left leg which she states is her "gout." Although she has also had sciatica.  Pt usually takes 10 mg Prednisone PRN with relief but it has not been giving her relief this time around. She also complains of nausea which she attributes to the pain she is having. Pt took 20 mg Oxycodone yesterday for her pain without relief. Denies hip pain, knee pain, neck pain, fever, cough, abdominal pain, vomiting, diarrhea, numbness, or any other associated symptoms.   Past Medical History  Diagnosis Date  . Hypertension   . Osteoporosis   . Anxiety   . Swelling   . Asthma   . Gout    Past Surgical History  Procedure Laterality Date  . Tubal ligation     History reviewed. No pertinent family history. Social History  Substance Use Topics  . Smoking status: Never Smoker   . Smokeless tobacco: None  . Alcohol Use: No   OB History    No data available     Review of Systems  Constitutional: Negative for fever, chills, diaphoresis and fatigue.  HENT: Negative for congestion, rhinorrhea and sneezing.   Eyes: Negative.   Respiratory:  Negative for cough, chest tightness and shortness of breath.   Cardiovascular: Negative for chest pain and leg swelling.  Gastrointestinal: Positive for nausea. Negative for vomiting, abdominal pain, diarrhea and blood in stool.  Genitourinary: Negative for frequency, hematuria, flank pain and difficulty urinating.  Musculoskeletal: Positive for back pain, joint swelling and arthralgias (left lower leg). Negative for neck pain.  Skin: Negative for rash.  Neurological: Negative for dizziness, syncope, speech difficulty, weakness, numbness and headaches.   Allergies  Sulfa antibiotics  Home Medications   Prior to Admission medications   Medication Sig Start Date End Date Taking? Authorizing Provider  acetaminophen (TYLENOL) 500 MG tablet Take 1,000 mg by mouth every 6 (six) hours as needed. For pain     Historical Provider, MD  albuterol (PROAIR HFA) 108 (90 BASE) MCG/ACT inhaler Inhale 2 puffs into the lungs every 6 (six) hours as needed. For asthma attack     Historical Provider, MD  carisoprodol (SOMA) 250 MG tablet Take 350 mg by mouth 4 (four) times daily.    Historical Provider, MD  clonazePAM (KLONOPIN) 1 MG tablet Take 1 mg by mouth 2 (two) times daily as needed.    Historical Provider, MD  DULoxetine (CYMBALTA) 60 MG capsule Take 60 mg by mouth daily.      Historical Provider, MD  DULoxetine HCl (CYMBALTA PO) Take by mouth.  Historical Provider, MD  Furosemide (LASIX PO) Take by mouth.      Historical Provider, MD  furosemide (LASIX) 20 MG tablet Take 1 tablet (20 mg total) by mouth daily. 05/16/11 05/15/12  Raeford Razor, MD  hydrochlorothiazide (HYDRODIURIL) 25 MG tablet Take 25 mg by mouth daily.      Historical Provider, MD  HYDROCHLOROTHIAZIDE PO Take by mouth.      Historical Provider, MD  olmesartan-hydrochlorothiazide (BENICAR HCT) 40-25 MG tablet Take 1 tablet by mouth daily. 05/12/15   Rolan Bucco, MD  Oxycodone-Acetaminophen (PERCOCET PO) Take by mouth.      Historical  Provider, MD  oxyCODONE-acetaminophen (PERCOCET) 10-325 MG per tablet Take 1 tablet by mouth every 4 (four) hours as needed. For pain     Historical Provider, MD   BP 172/84 mmHg  Pulse 76  Temp(Src) 97.7 F (36.5 C) (Oral)  Resp 20  Ht 5' 3.5" (1.613 m)  Wt 275 lb (124.739 kg)  BMI 47.94 kg/m2  SpO2 96%   Physical Exam  Constitutional: She is oriented to person, place, and time. She appears well-developed and well-nourished.  obese  HENT:  Head: Normocephalic and atraumatic.  Eyes: Pupils are equal, round, and reactive to light.  Neck: Normal range of motion. Neck supple.  Cardiovascular: Normal rate, regular rhythm and normal heart sounds.   Pulmonary/Chest: Effort normal and breath sounds normal. No respiratory distress. She has no wheezes. She has no rales. She exhibits no tenderness.  Abdominal: Soft. Bowel sounds are normal. There is no tenderness. There is no rebound and no guarding.  Musculoskeletal: Normal range of motion. She exhibits no edema.  Pain along his left lower paraspinal area in the lumbar region in the left sciatica. There is no swelling or deformity. No actual spinal tenderness. She also has an area of ecchymosis to her anterior lower leg. There is some mild tenderness to this area. There's also some tenderness to the posterior calf on the left. The left leg is more swollen than the right leg. There is a firm varicose vein to the left lateral leg. There is no warmth or erythema. Pedal pulses are intact. She has normal sensation and motor function in the legs. There is no pain on palpation or range of motion of the knee or the hip.  Lymphadenopathy:    She has no cervical adenopathy.  Neurological: She is alert and oriented to person, place, and time.  Skin: Skin is warm and dry. No rash noted.  Psychiatric: She has a normal mood and affect.    ED Course  Procedures (including critical care time)  DIAGNOSTIC STUDIES: Oxygen Saturation is 98% on RA, normal by  my interpretation.    COORDINATION OF CARE: 3:50 PM-Discussed treatment plan which includes [place] Lower extremity with pt at bedside and pt agreed to plan.   Labs Review Labs Reviewed - No data to display  Imaging Review Dg Tibia/fibula Left  05/12/2015  CLINICAL DATA:  Left lower leg pain and swelling for 3 days. EXAM: LEFT TIBIA AND FIBULA - 2 VIEW COMPARISON:  Left knee radiographs 07/01/2005 FINDINGS: Progressive degenerative changes are present at the left knee. Mild degenerative changes are noted at the ankle. No acute or focal osseous abnormality is present. There is diffuse edema throughout the lower extremity. Subcutaneous calcifications are noted anteriorly. IMPRESSION: 1. Progressive degenerative changes of the knee. 2. Diffuse edema throughout the lower extremity without a focal etiology. 3. No acute osseous abnormality. Electronically Signed   By: Cristal Deer  Mattern M.D.   On: 05/12/2015 16:52   US Venous Img Lower Unilateral Left  05/12/2015  CLINICAL DATA:  Left lower extremity pain and swelling for 3 days. Hard, red and tender area in the upper calf. EXAM: LEFT LOWER EXTREMITY VENOUS DOPPLER ULTRASOUND TECHNIQUE: Gray-scale sonography with graded compression, as well as color Doppler and duplex ultrasound, were performed to evaluate the deep venous system from the level of the common femoral vein through the popliteal and proximal calf veins. Spectral Doppler was utilized to evaluate flow at rest and with distal augmentation maneuvers. COMPARISON:  11/22/2010 FINDINGS: Right common femoral vein is patent without thrombus. Normal compressibility, augmentation and color Doppler flow in the left common femoral vein, left femoral vein and left popliteal vein. The left saphenofemoral junction is patent. Left profunda femoral vein is patent without thrombus. Limited evaluation of the left calf veins but visualized deep calf veins are patent. The left great saphenous vein is compressible and  patent. There is a thrombosed superficial vein along the lateral and posterior left knee. There is an anechoic structure in the left popliteal fossa measuring 5.0 x 1.4 x 3.8 cm. This is suggestive for a mildly complex Baker's cyst. IMPRESSION: Negative for deep venous thrombosis in left lower extremity. Thrombosed superficial vein or varicose vein along the lateral and posterior left knee. Findings are suggestive for acute superficial thrombophlebitis. Baker's cyst. Electronically Signed   By: Richarda Overlie M.D.   On: 05/12/2015 17:36   I have personally reviewed and evaluated these images as part of my medical decision-making.   EKG Interpretation None      MDM   Final diagnoses:  Sciatica of left side  Superficial thrombophlebitis   Patient's x-rays are negative for fracture. She doesn't have any bony tenderness to the knee or hip. There is no evidence of a DVT. There is evidence for superficial thrombophlebitis. She also appears to have sciatica which she has had in the past. She's neurologically intact. She was given a shot of Decadron. She states her sciatica has improved after steroids in the past. She was also given a shot of Dilaudid for pain. She takes oxycodone at home for chronic pain. She's followed by a primary care/pain management physician who manages both her pain and her hypertension. She states that she ran out of her Benicar. She has an appointment with her physician on Monday which is in 7 days. She was given a prescription here in the ED for a one-week supply of her Benicar. She was advised that she should have a repeat ultrasound in 1-2 weeks to reassess for DVT. This can be arranged by her PCP. There is no evidence of cellulitis.  I personally performed the services described in this documentation, which was scribed in my presence.  The recorded information has been reviewed and considered.       Rolan Bucco, MD 05/12/15 [phone]

## 2015-05-12 NOTE — ED Notes (Signed)
Pt requesting to use [facility]. Crying, states "I don't have a call bell and I had to call my daughter"; Showed pt call bell which was attached to siderail and pt had her arm on it. Pt states I didn't see it. Pt taken to [facility] in wheelchair by EMT

## 2015-05-12 NOTE — ED Notes (Signed)
Pt reports that she 'has a clogged vein' and that usually 'prednisone usually makes it go away'.  Reports that it has been swollen x 3 days.  Pt reports PCP has dx her with gout.

## 2015-05-12 NOTE — ED Notes (Signed)
Pt d/c home with family to drive. Rx x 1 given at discharge. Pt states she "feels much better"

## 2015-06-30 DEATH — deceased

## 2016-08-10 IMAGING — US US EXTREM LOW VENOUS*L*
1 series · 13 of 24 positions shown · non-contrast
Comparison: 11/22/2010

CLINICAL DATA: Left lower extremity pain and swelling for 3 days.
Hard, red and tender area in the upper calf.

EXAM:
LEFT LOWER EXTREMITY VENOUS DOPPLER ULTRASOUND
TECHNIQUE: Gray-scale sonography with graded compression, as well as color
Doppler and duplex ultrasound, were performed to evaluate the deep
venous system from the level of the common femoral vein through the
popliteal and proximal calf veins. Spectral Doppler was utilized to
evaluate flow at rest and with distal augmentation maneuvers.

[Series 1: us extrem low venous*left* · 0.09mm/px · 30 acquisitions, 13 frames shown]
[im 1/30]
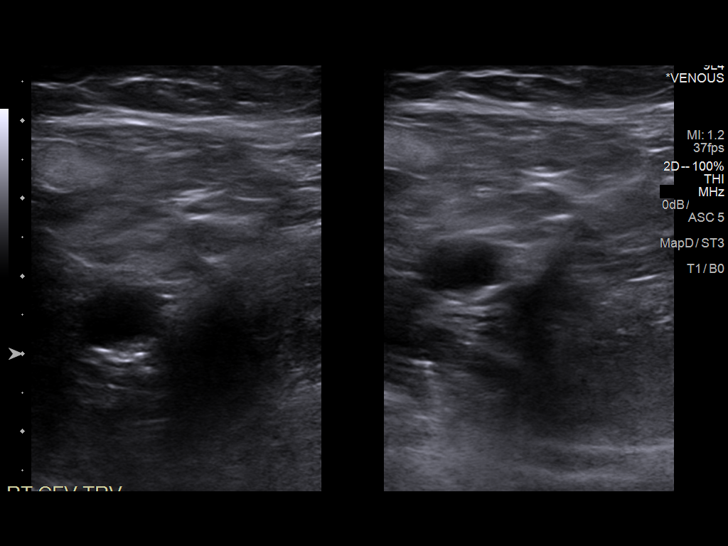
[im 3/30]
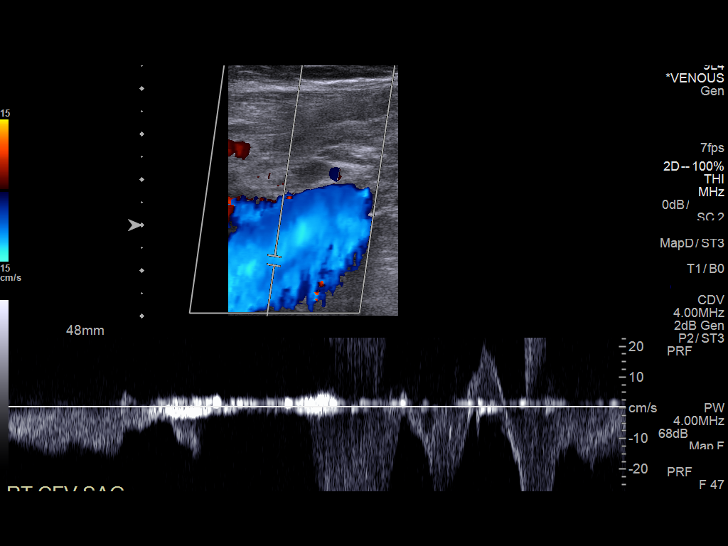
[im 6/30]
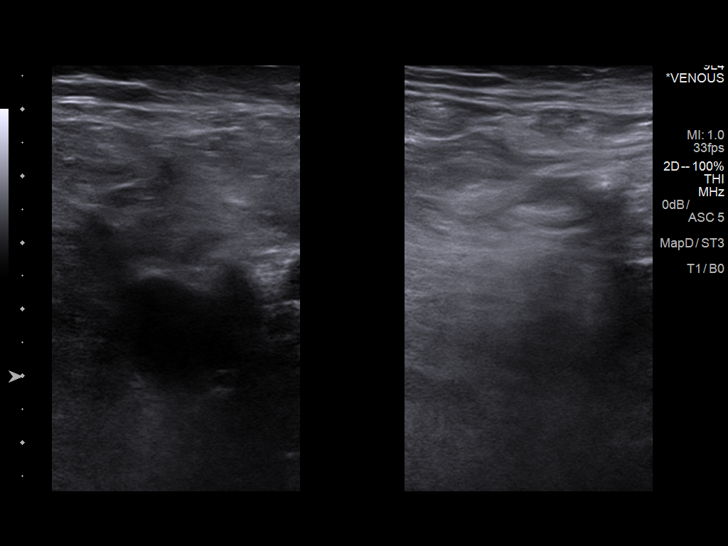
[im 8/30]
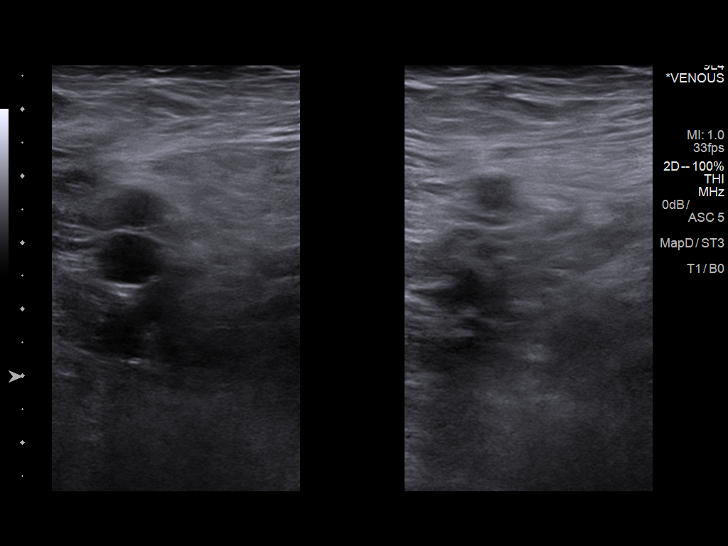
[im 11/30]
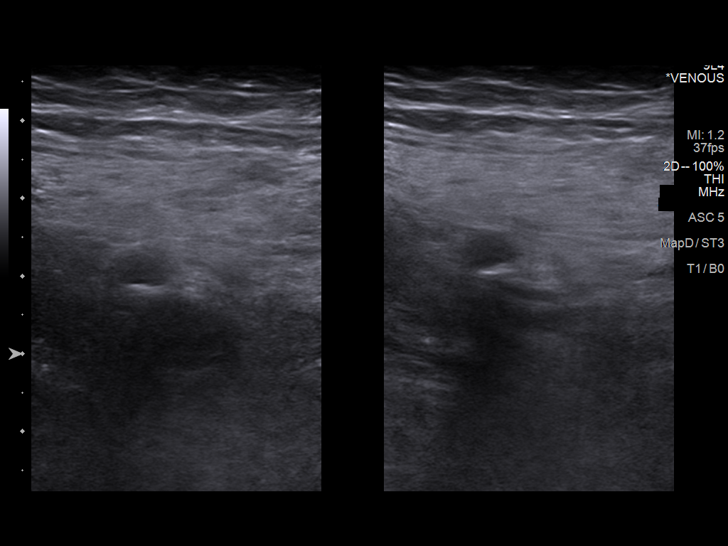
[im 13/30]
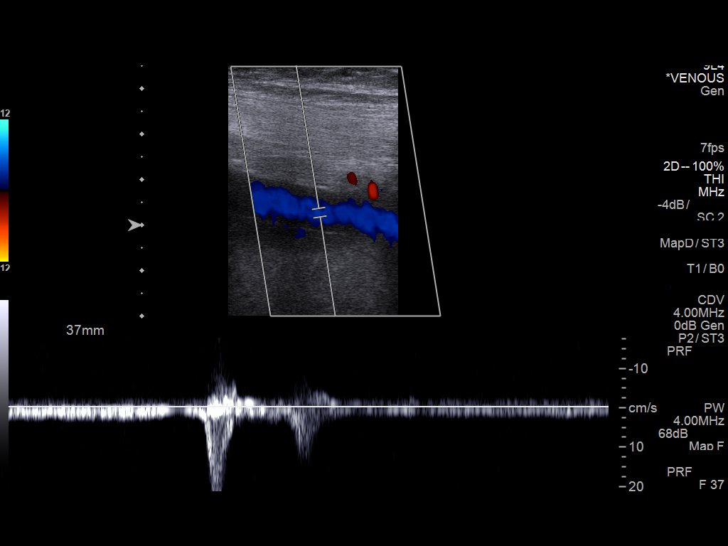
[im 17/30]
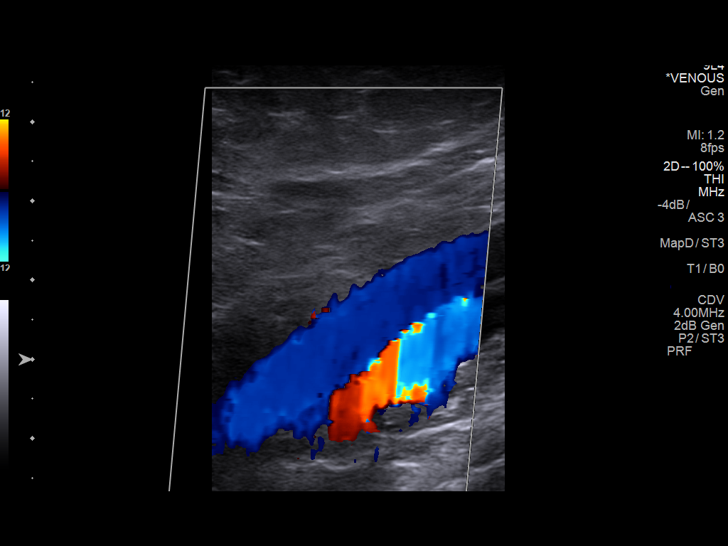
[im 18/30]
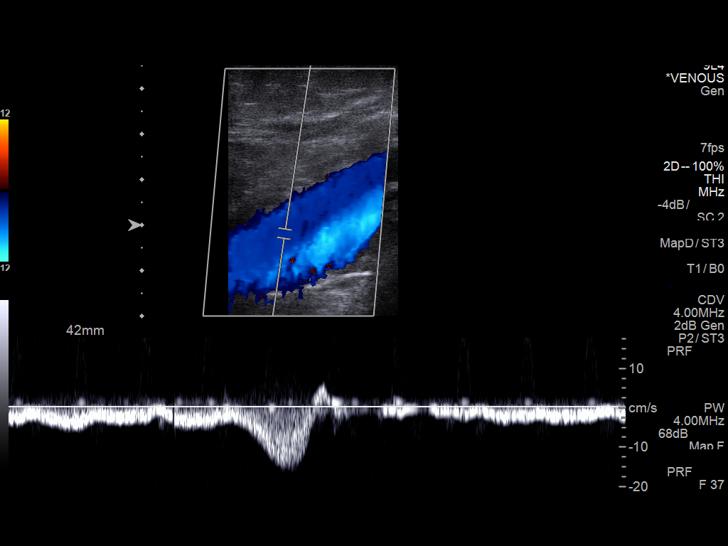
[im 21/30]
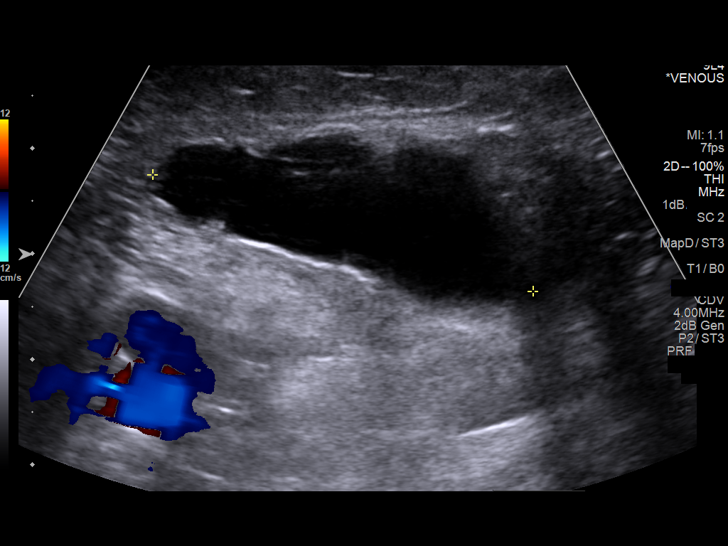
[im 23/30]
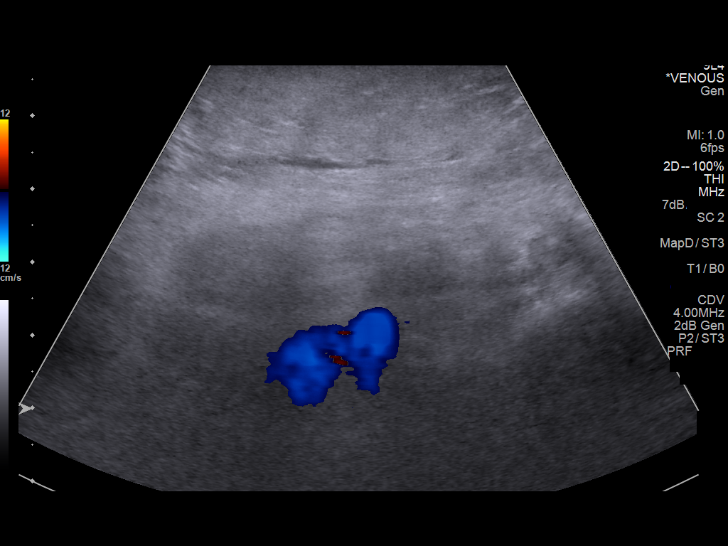
[im 24/30]
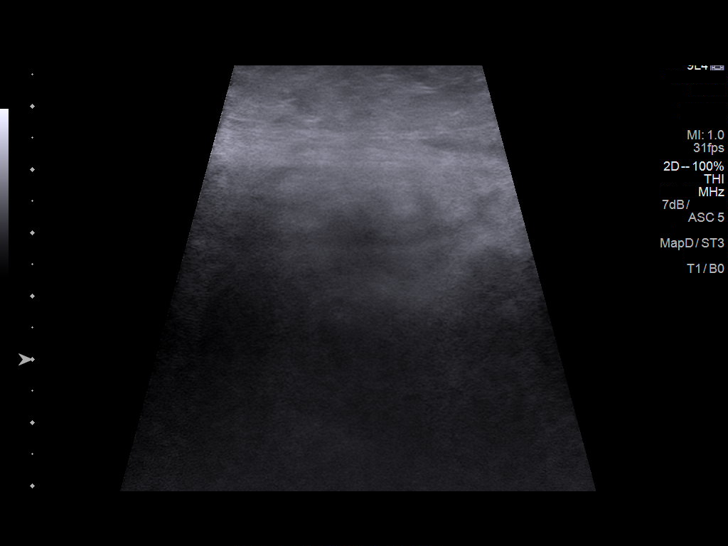
[im 27/30]
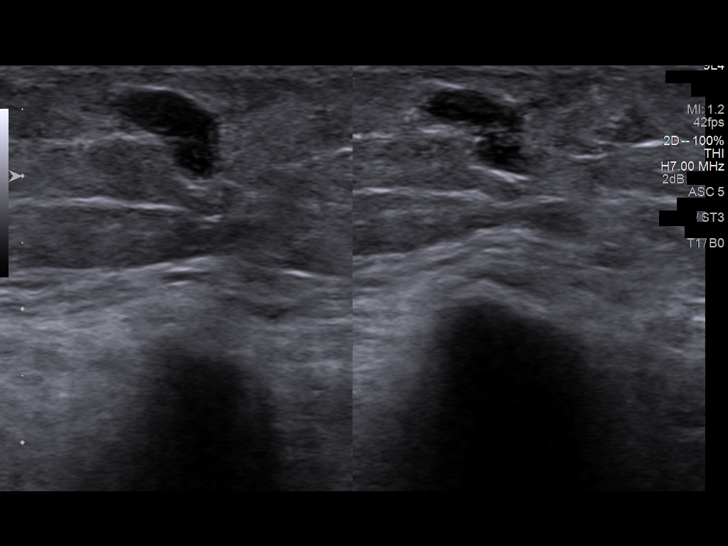
[im 30/30]
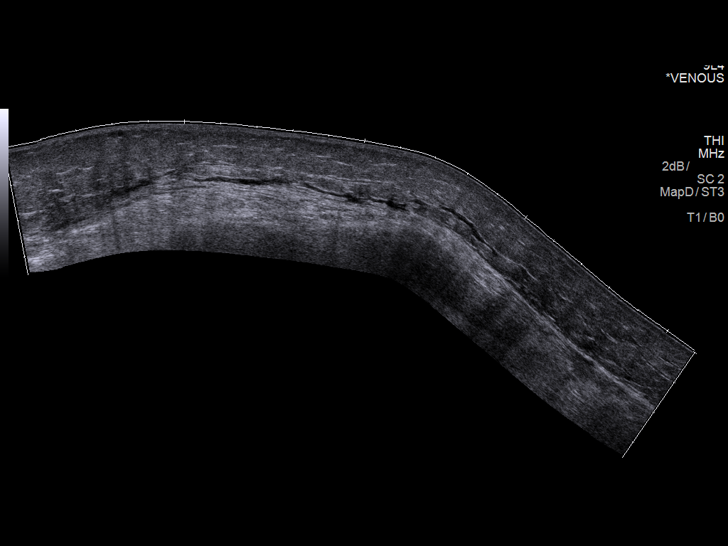

[13 of 24 positions shown; findings below may reference images not displayed]

FINDINGS: Right common femoral vein is patent without thrombus.

Normal compressibility, augmentation and color Doppler flow in the
left common femoral vein, left femoral vein and left popliteal vein.
The left saphenofemoral junction is patent. Left profunda femoral
vein is patent without thrombus. Limited evaluation of the left calf
veins but visualized deep calf veins are patent.

The left great saphenous vein is compressible and patent. There is a
thrombosed superficial vein along the lateral and posterior left
knee.

There is an anechoic structure in the left popliteal fossa measuring
5.0 x 1.4 x 3.8 cm. This is suggestive for a mildly complex Baker's
cyst.
IMPRESSION: Negative for deep venous thrombosis in left lower extremity.

Thrombosed superficial vein or varicose vein along the lateral and
posterior left knee. Findings are suggestive for acute superficial
thrombophlebitis.

Baker's cyst.

## 2016-11-15 IMAGING — DX DG TIBIA/FIBULA 2V*L*
3 series · 3 of 3 positions shown · non-contrast
Comparison: Left knee radiographs 07/01/2005

CLINICAL DATA: Left lower leg pain and swelling for 3 days.

EXAM:
LEFT TIBIA AND FIBULA - 2 VIEW

[tibia ap (1 of 2)]
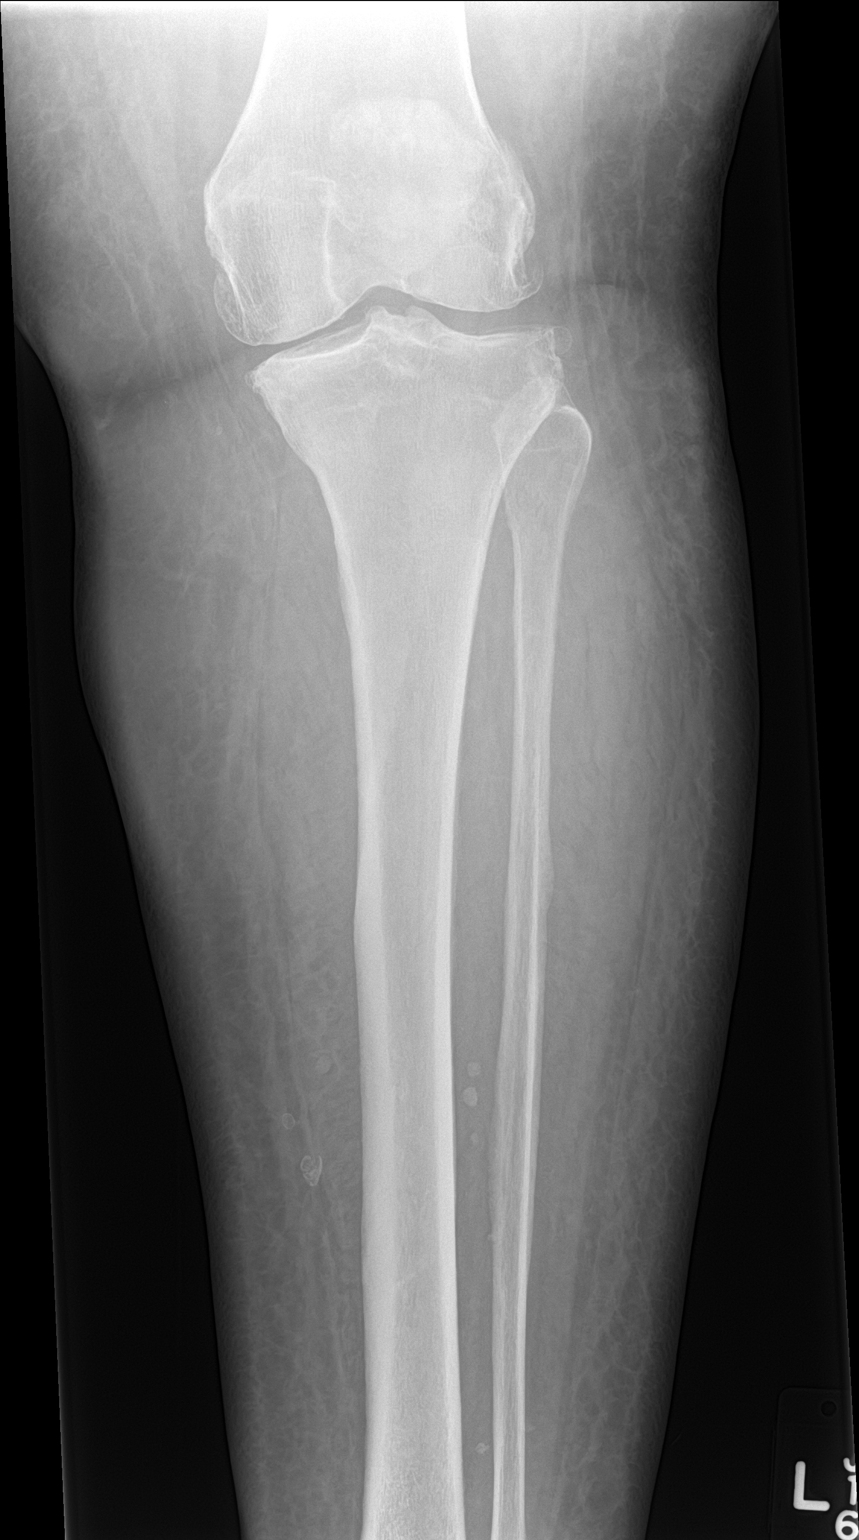

[tibia ap (2 of 2)]
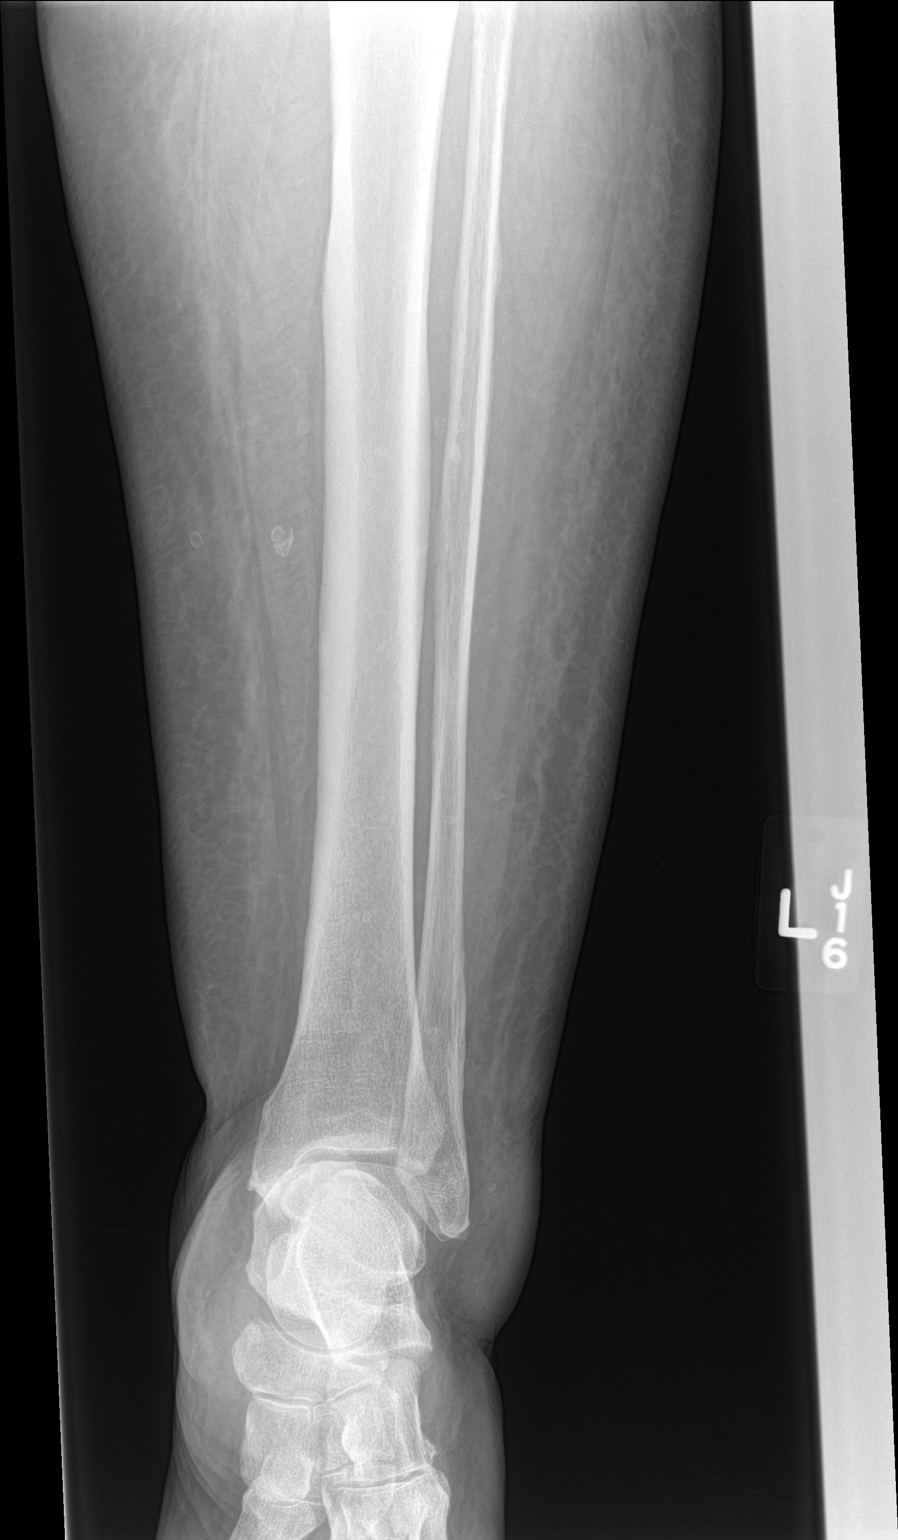

[tibia lat]
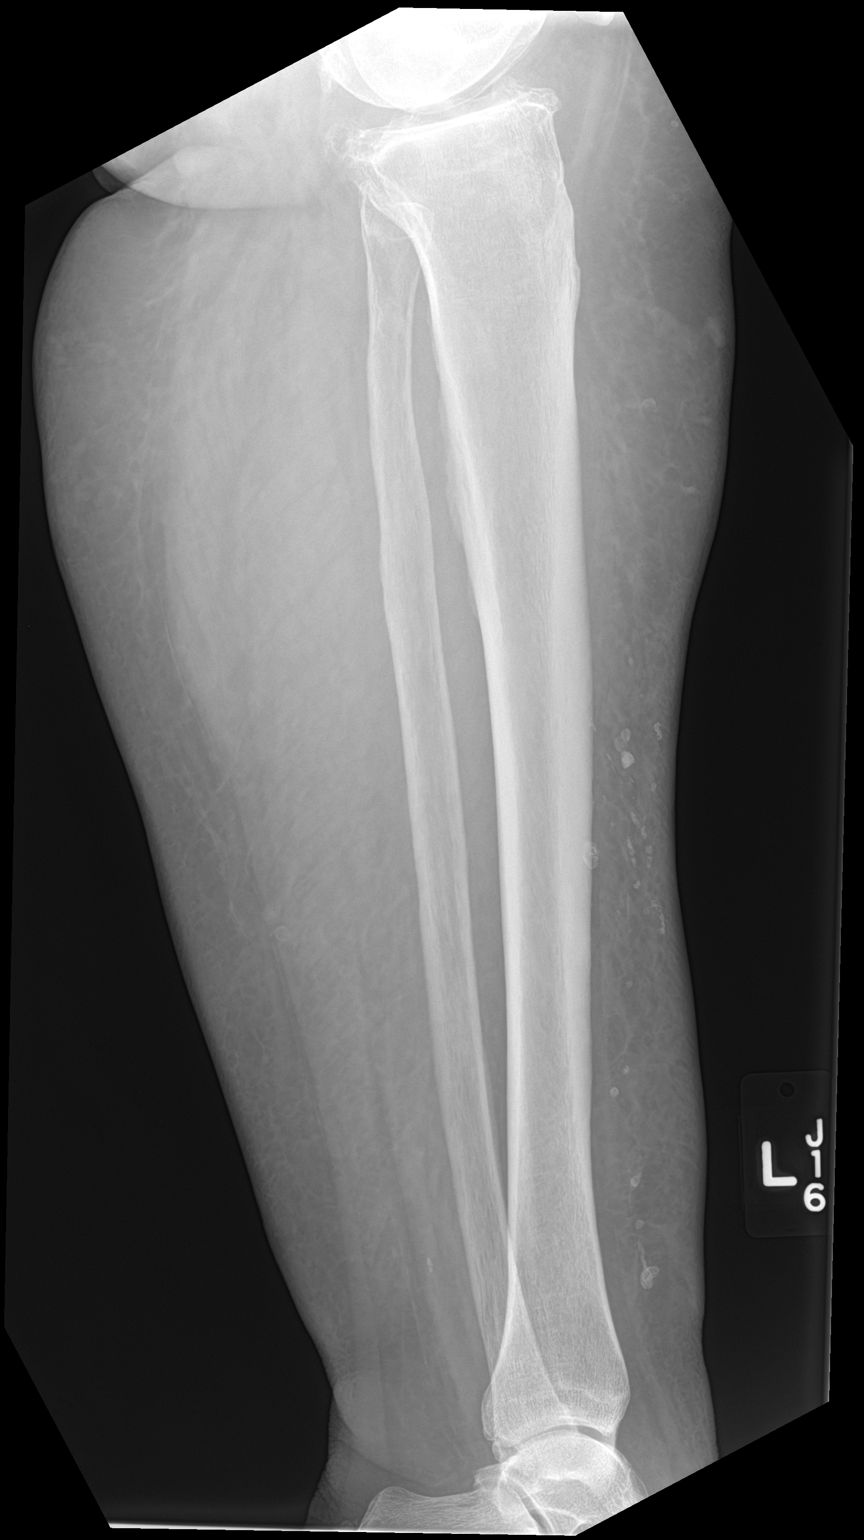

[3 of 3 positions shown; findings below may reference images not displayed]

FINDINGS: Progressive degenerative changes are present at the left knee. Mild
degenerative changes are noted at the ankle. No acute or focal
osseous abnormality is present. There is diffuse edema throughout
the lower extremity. Subcutaneous calcifications are noted
anteriorly.
IMPRESSION: 1. Progressive degenerative changes of the knee.
2. Diffuse edema throughout the lower extremity without a focal
etiology.
3. No acute osseous abnormality.
